# Patient Record
Sex: Female | Born: 1960 | Race: White | Hispanic: No | Smoking: Current every day smoker
Health system: Southern US, Community
[De-identification: ages and names within clinical notes are randomized; demographics above are authoritative.]

## PROBLEM LIST (undated history)

## (undated) DIAGNOSIS — E7800 Pure hypercholesterolemia, unspecified: Secondary | ICD-10-CM

## (undated) DIAGNOSIS — F419 Anxiety disorder, unspecified: Secondary | ICD-10-CM

## (undated) DIAGNOSIS — K219 Gastro-esophageal reflux disease without esophagitis: Secondary | ICD-10-CM

## (undated) DIAGNOSIS — IMO0001 Reserved for inherently not codable concepts without codable children: Secondary | ICD-10-CM

## (undated) DIAGNOSIS — F4000 Agoraphobia, unspecified: Secondary | ICD-10-CM

## (undated) DIAGNOSIS — I10 Essential (primary) hypertension: Secondary | ICD-10-CM

## (undated) DIAGNOSIS — F410 Panic disorder [episodic paroxysmal anxiety] without agoraphobia: Secondary | ICD-10-CM

## (undated) HISTORY — DX: Agoraphobia, unspecified: F40.00

---

## 2009-12-09 ENCOUNTER — Emergency Department: Payer: Self-pay | Admitting: Emergency Medicine

## 2009-12-26 ENCOUNTER — Ambulatory Visit: Payer: Self-pay | Admitting: Internal Medicine

## 2011-03-24 ENCOUNTER — Emergency Department: Payer: Self-pay | Admitting: Emergency Medicine

## 2011-06-26 ENCOUNTER — Ambulatory Visit: Payer: Self-pay | Admitting: Family Medicine

## 2012-11-16 ENCOUNTER — Encounter (HOSPITAL_COMMUNITY): Payer: Self-pay | Admitting: *Deleted

## 2012-11-16 ENCOUNTER — Emergency Department (HOSPITAL_COMMUNITY): Payer: Self-pay

## 2012-11-16 ENCOUNTER — Emergency Department (HOSPITAL_COMMUNITY)
Admission: EM | Admit: 2012-11-16 | Discharge: 2012-11-16 | Disposition: A | Discharge status: Home / Self Care | Payer: Self-pay | Attending: Emergency Medicine | Admitting: Emergency Medicine

## 2012-11-16 DIAGNOSIS — R509 Fever, unspecified: Secondary | ICD-10-CM | POA: Insufficient documentation

## 2012-11-16 DIAGNOSIS — R071 Chest pain on breathing: Secondary | ICD-10-CM | POA: Insufficient documentation

## 2012-11-16 DIAGNOSIS — K219 Gastro-esophageal reflux disease without esophagitis: Secondary | ICD-10-CM | POA: Insufficient documentation

## 2012-11-16 DIAGNOSIS — I10 Essential (primary) hypertension: Secondary | ICD-10-CM | POA: Insufficient documentation

## 2012-11-16 DIAGNOSIS — R062 Wheezing: Secondary | ICD-10-CM | POA: Insufficient documentation

## 2012-11-16 DIAGNOSIS — M549 Dorsalgia, unspecified: Secondary | ICD-10-CM | POA: Insufficient documentation

## 2012-11-16 DIAGNOSIS — F17200 Nicotine dependence, unspecified, uncomplicated: Secondary | ICD-10-CM | POA: Insufficient documentation

## 2012-11-16 DIAGNOSIS — Z791 Long term (current) use of non-steroidal anti-inflammatories (NSAID): Secondary | ICD-10-CM | POA: Insufficient documentation

## 2012-11-16 DIAGNOSIS — J209 Acute bronchitis, unspecified: Secondary | ICD-10-CM | POA: Insufficient documentation

## 2012-11-16 DIAGNOSIS — E7800 Pure hypercholesterolemia, unspecified: Secondary | ICD-10-CM | POA: Insufficient documentation

## 2012-11-16 DIAGNOSIS — R61 Generalized hyperhidrosis: Secondary | ICD-10-CM | POA: Insufficient documentation

## 2012-11-16 DIAGNOSIS — R0781 Pleurodynia: Secondary | ICD-10-CM

## 2012-11-16 DIAGNOSIS — Z79899 Other long term (current) drug therapy: Secondary | ICD-10-CM | POA: Insufficient documentation

## 2012-11-16 DIAGNOSIS — F411 Generalized anxiety disorder: Secondary | ICD-10-CM | POA: Insufficient documentation

## 2012-11-16 DIAGNOSIS — R110 Nausea: Secondary | ICD-10-CM | POA: Insufficient documentation

## 2012-11-16 DIAGNOSIS — Z7982 Long term (current) use of aspirin: Secondary | ICD-10-CM | POA: Insufficient documentation

## 2012-11-16 DIAGNOSIS — R630 Anorexia: Secondary | ICD-10-CM | POA: Insufficient documentation

## 2012-11-16 DIAGNOSIS — J40 Bronchitis, not specified as acute or chronic: Secondary | ICD-10-CM

## 2012-11-16 HISTORY — DX: Panic disorder (episodic paroxysmal anxiety): F41.0

## 2012-11-16 HISTORY — DX: Reserved for inherently not codable concepts without codable children: IMO0001

## 2012-11-16 HISTORY — DX: Anxiety disorder, unspecified: F41.9

## 2012-11-16 HISTORY — DX: Essential (primary) hypertension: I10

## 2012-11-16 HISTORY — DX: Pure hypercholesterolemia, unspecified: E78.00

## 2012-11-16 HISTORY — DX: Gastro-esophageal reflux disease without esophagitis: K21.9

## 2012-11-16 MED ORDER — CYCLOBENZAPRINE HCL 10 MG PO TABS
10.0000 mg | ORAL_TABLET | Freq: Once | ORAL | Status: AC
  Administered 2012-11-16: 10 mg via ORAL
  Filled 2012-11-16: qty 1

## 2012-11-16 MED ORDER — HYDROCODONE-ACETAMINOPHEN 5-325 MG PO TABS
2.0000 | ORAL_TABLET | Freq: Once | ORAL | Status: AC
  Administered 2012-11-16: 2 via ORAL
  Filled 2012-11-16: qty 2

## 2012-11-16 MED ORDER — HYDROCODONE-ACETAMINOPHEN 5-325 MG PO TABS: ORAL_TABLET | ORAL | Status: DC

## 2012-11-16 MED ORDER — PROMETHAZINE HCL 25 MG PO TABS: 25.0000 mg | ORAL_TABLET | Freq: Four times a day (QID) | ORAL | Status: DC | PRN

## 2012-11-16 MED ORDER — SULFAMETHOXAZOLE-TRIMETHOPRIM 800-160 MG PO TABS: 1.0000 | ORAL_TABLET | Freq: Two times a day (BID) | ORAL | Status: DC

## 2012-11-16 MED ORDER — CYCLOBENZAPRINE HCL 10 MG PO TABS: 10.0000 mg | ORAL_TABLET | Freq: Three times a day (TID) | ORAL | Status: DC | PRN

## 2012-11-16 MED ORDER — GUAIFENESIN ER 1200 MG PO TB12: 1.0000 | ORAL_TABLET | Freq: Two times a day (BID) | ORAL | Status: DC

## 2012-11-16 NOTE — ED Notes (Signed)
Sick x 1 week, fever, cough, pain in rt lat ribs.  White sputum.   Nausea, no vomiting. Diarrhea  yesterday

## 2012-11-16 NOTE — ED Notes (Signed)
Reports being sick for a week. Was coughing & heard something pop to right chest. Pt states pain w/ coughing & taking a deep breath.

## 2012-11-16 NOTE — ED Provider Notes (Signed)
History    This chart was scribed for Brittany Norrie, MD by Brittany Jacobson, ED Scribe. The patient was seen in room APA02/APA02. Patient's care was started at 2225.   CSN: [id number]  Arrival date & time 11/16/12  2158   First MD Initiated Contact with Patient 11/16/12 2225      Chief Complaint  Patient presents with  . Cough    The history is provided by the patient. No language interpreter was used.   Brittany Jacobson is a 52 y.o. female who presents to the Emergency Department complaining of a persistent, frequent productive cough for the past week that worsened last night. She states that she had flu-like symptoms last week with diaphroeis, fever, chills, nausea, vomiting, cough and decreased appetite. She states that her symptoms improved and she returned to work yesterday. She states last night her cough worsened again and heard cracking in her right posterior ribs. She reports her cough is productive with white/tan sputum. She reports associated sneezing, wheezing, nausea, clammy, subjective fever and chills. She denies any sore throat, rhinorrhea. She states that she used to take Ventolin for wheezing.   PCP: Dr. Jimmye Jacobson at [facility]  Past Medical History  Diagnosis Date  . Hypertension   . Hypercholesteremia   . Panic attacks   . Reflux   . Anxiety     History reviewed. No pertinent past surgical history.  History reviewed. No pertinent family history.  History  Substance Use Topics  . Smoking status: Current Every Day Smoker  . Smokeless tobacco: Not on file  . Alcohol Use: Yes  She admits to smoking a pack daily Occasional alcohol use Works at [employer] with spouse  OB History   Grav Para Term Preterm Abortions TAB SAB Ect Mult Living                  Review of Systems  Constitutional: Positive for fever, chills, diaphoresis and appetite change.  HENT: Negative for sore throat and rhinorrhea.   Respiratory: Positive for cough and  wheezing.   Gastrointestinal: Positive for nausea. Negative for vomiting, abdominal pain and diarrhea.  Musculoskeletal: Positive for back pain.  All other systems reviewed and are negative.    Allergies  Codeine  Home Medications   Current Outpatient Rx  Name  Route  Sig  Dispense  Refill  . ALPRAZolam (XANAX) 0.5 MG tablet   Oral   Take 0.25-1 mg by mouth daily as needed for sleep or anxiety.         Marland Kitchen amLODipine (NORVASC) 5 MG tablet   Oral   Take 5 mg by mouth every evening.         Marland Kitchen aspirin EC 325 MG tablet   Oral   Take 325 mg by mouth every evening.         Marland Kitchen atorvastatin (LIPITOR) 40 MG tablet   Oral   Take 40 mg by mouth every evening.         . citalopram (CELEXA) 40 MG tablet   Oral   Take 40 mg by mouth every evening.         . meloxicam (MOBIC) 15 MG tablet   Oral   Take 15 mg by mouth every evening.         . pantoprazole (PROTONIX) 40 MG tablet   Oral   Take 40 mg by mouth every evening.           Triage Vitals: BP  124/77  Pulse 81  Temp(Src) 98.2 F (36.8 C) (Oral)  Resp 18  Ht 5\' 2"  (1.575 m)  Wt 135 lb (61.236 kg)  BMI 24.69 kg/m2  SpO2 100%  Vital signs normal    Physical Exam  Nursing note and vitals reviewed. Constitutional: She is oriented to person, place, and time. She appears well-developed and well-nourished. No distress.  HENT:  Head: Normocephalic and atraumatic.  Right Ear: External ear normal.  Left Ear: External ear normal.  Nose: Nose normal.  Mouth/Throat: No oropharyngeal exudate.  Tongue is dry.   Eyes: Conjunctivae and EOM are normal. Pupils are equal, round, and reactive to light.  Neck: Normal range of motion. Neck supple. No tracheal deviation present.  Cardiovascular: Normal rate, regular rhythm and normal heart sounds.  Exam reveals no gallop and no friction rub.   No murmur heard. Pulmonary/Chest: Effort normal. No respiratory distress. She has decreased breath sounds. She has no wheezes.  She has no rhonchi. She has no rales. She exhibits no tenderness.    No localized chest wall tenderness although pt localized pain to right posterior/lateral ribs. Area of pain noted  Abdominal: Soft. Bowel sounds are normal. She exhibits no distension. There is no tenderness. There is no rebound and no guarding.  Musculoskeletal: Normal range of motion. She exhibits no edema and no tenderness.  Neurological: She is alert and oriented to person, place, and time.  Skin: Skin is warm and dry.  Psychiatric: She has a normal mood and affect. Her behavior is normal.    ED Course  Procedures (including critical care time)  Medications  HYDROcodone-acetaminophen (NORCO/VICODIN) 5-325 MG per tablet 2 tablet (not administered)  cyclobenzaprine (FLEXERIL) tablet 10 mg (not administered)     DIAGNOSTIC STUDIES: Oxygen Saturation is 100% on room air, normal by my interpretation.    COORDINATION OF CARE:  22:46-Discussed planned course of treatment with the patient including d/c with pain and nausea medication and an antibiotic, who is agreeable at this time. Pt has no wheezing at this time.     Dg Chest 2 View  11/16/2012  *RADIOLOGY REPORT*  Clinical Data: Fever.  Cough.  Right rib pain.  CHEST - 2 VIEW  Comparison: None.  Findings: Lateral right rib deformities favor old fractures.  The lungs appear clear. Cardiac and mediastinal contours appear unremarkable.  Linear subsegmental atelectasis or scarring noted in the lingula. No pleural effusion noted.  IMPRESSION:  1.  Lingular subsegmental atelectasis or scarring. 2.  Old right lateral rib fractures.   Original Report Authenticated By: Van Clines, M.D.      1. Bronchitis   2. Nausea   3. Pleuritic chest pain     New Prescriptions   CYCLOBENZAPRINE (FLEXERIL) 10 MG TABLET    Take 1 tablet (10 mg total) by mouth 3 (three) times daily as needed for muscle spasms.   GUAIFENESIN 1200 MG TB12    Take 1 tablet (1,200 mg total) by  mouth 2 (two) times daily.   HYDROCODONE-ACETAMINOPHEN (NORCO/VICODIN) 5-325 MG PER TABLET    Take 1 or 2 po Q 6hrs for pain   PROMETHAZINE (PHENERGAN) 25 MG TABLET    Take 1 tablet (25 mg total) by mouth every 6 (six) hours as needed for nausea.   SULFAMETHOXAZOLE-TRIMETHOPRIM (SEPTRA DS) 800-160 MG PER TABLET    Take 1 tablet by mouth every 12 (twelve) hours.   Plan discharge  Rolland Porter, MD, FACEP   MDM    I personally performed the services  described in this documentation, which was scribed in my presence. The recorded information has been reviewed and considered.  Rolland Porter, MD, Abram Sander      Brittany Norrie, MD 11/16/12 2318

## 2012-11-16 NOTE — ED Notes (Signed)
Pt alert & oriented x4, stable gait. Patient given discharge instructions, paperwork & prescription(s). Patient  instructed to stop at the registration desk to finish any additional paperwork. Patient verbalized understanding. Pt left department w/ no further questions. 

## 2014-09-27 ENCOUNTER — Ambulatory Visit: Payer: Self-pay | Admitting: Family

## 2014-10-07 LAB — HM HEPATITIS C SCREENING LAB: HM Hepatitis Screen: NEGATIVE

## 2014-10-07 LAB — HM HIV SCREENING LAB: HM HIV Screening: NEGATIVE

## 2016-08-03 ENCOUNTER — Encounter (HOSPITAL_COMMUNITY): Payer: Self-pay | Admitting: Emergency Medicine

## 2016-08-03 ENCOUNTER — Emergency Department (HOSPITAL_COMMUNITY): Payer: Managed Care, Other (non HMO)

## 2016-08-03 ENCOUNTER — Emergency Department (HOSPITAL_COMMUNITY)
Admission: EM | Admit: 2016-08-03 | Discharge: 2016-08-03 | Disposition: A | Discharge status: Home / Self Care | Payer: Managed Care, Other (non HMO) | Attending: Emergency Medicine | Admitting: Emergency Medicine

## 2016-08-03 DIAGNOSIS — M545 Low back pain, unspecified: Secondary | ICD-10-CM

## 2016-08-03 DIAGNOSIS — Z760 Encounter for issue of repeat prescription: Secondary | ICD-10-CM | POA: Diagnosis not present

## 2016-08-03 DIAGNOSIS — R109 Unspecified abdominal pain: Secondary | ICD-10-CM | POA: Insufficient documentation

## 2016-08-03 DIAGNOSIS — I10 Essential (primary) hypertension: Secondary | ICD-10-CM | POA: Diagnosis not present

## 2016-08-03 DIAGNOSIS — Z79899 Other long term (current) drug therapy: Secondary | ICD-10-CM | POA: Insufficient documentation

## 2016-08-03 DIAGNOSIS — F17200 Nicotine dependence, unspecified, uncomplicated: Secondary | ICD-10-CM | POA: Insufficient documentation

## 2016-08-03 LAB — URINALYSIS, ROUTINE W REFLEX MICROSCOPIC
Bilirubin Urine: NEGATIVE
Glucose, UA: NEGATIVE mg/dL
Ketones, ur: NEGATIVE mg/dL
Leukocytes, UA: NEGATIVE
Nitrite: NEGATIVE
Protein, ur: >300 mg/dL — AB
Specific Gravity, Urine: 1.025 (ref 1.005–1.030)
pH: 6.5 (ref 5.0–8.0)

## 2016-08-03 LAB — URINE MICROSCOPIC-ADD ON

## 2016-08-03 MED ORDER — HYDROXYZINE HCL 25 MG PO TABS: 25.0000 mg | ORAL_TABLET | Freq: Four times a day (QID) | ORAL | 0 refills | Status: DC | PRN

## 2016-08-03 MED ORDER — HYDROCODONE-ACETAMINOPHEN 5-325 MG PO TABS: ORAL_TABLET | ORAL | 0 refills | Status: DC

## 2016-08-03 MED ORDER — METHOCARBAMOL 500 MG PO TABS: 1000.0000 mg | ORAL_TABLET | Freq: Four times a day (QID) | ORAL | 0 refills | Status: DC | PRN

## 2016-08-03 MED ORDER — OXYCODONE-ACETAMINOPHEN 5-325 MG PO TABS
1.0000 | ORAL_TABLET | Freq: Once | ORAL | Status: AC
Start: 2016-08-03 — End: 2016-08-03
  Administered 2016-08-03: 1 via ORAL
  Filled 2016-08-03: qty 1

## 2016-08-03 MED ORDER — ONDANSETRON 8 MG PO TBDP
8.0000 mg | ORAL_TABLET | Freq: Once | ORAL | Status: AC
  Administered 2016-08-03: 8 mg via ORAL
  Filled 2016-08-03: qty 1

## 2016-08-03 NOTE — Discharge Instructions (Signed)
Take the prescriptions as directed.  Apply moist heat or ice to the area(s) of discomfort, for 15 minutes at a time, several times per day for the next few days.  Do not fall asleep on a heating or ice pack.  Call your regular medical doctor on Monday to schedule a follow up appointment this week.  Return to the Emergency Department immediately if worsening. ° °

## 2016-08-03 NOTE — ED Triage Notes (Signed)
PT c/o right flank pain with painful urination at times for 3-4 days. PT states she also had a vaginal yeast infection that seems to have subsided with home OTC medication x3 days ago.

## 2016-08-03 NOTE — ED Provider Notes (Signed)
[facility] DEPT Provider Note   CSN: [id number] Arrival date & time: 08/03/16  1353     History   Chief Complaint Chief Complaint  Patient presents with  . Flank Pain    HPI Brittany Jacobson is a 55 y.o. female.   Flank Pain    Pt was seen at 1405. Per pt, c/o gradual onset and persistence of constant right sided low back "pain" for the past 3 to 4 days. Pain worsens with palpation of the area and body position changes. Pt states she was having dysuria, but treated herself with OTC vaginal yeast cream with improvement. Denies incont/retention of bowel or bladder, no saddle anesthesia, no focal motor weakness, no tingling/numbness in extremities, no fevers, no injury, no abd pain, no further dysuria, no hematuria.   The symptoms have been associated with no other complaints.   Past Medical History:  Diagnosis Date  . Anxiety   . Hypercholesteremia   . Hypertension   . Panic attacks   . Reflux     There are no active problems to display for this patient.   History reviewed. No pertinent surgical history.  OB History    Gravida Para Term Preterm AB Living   2         2   SAB TAB Ectopic Multiple Live Births                   Home Medications    Prior to Admission medications   Medication Sig Start Date End Date Taking? Authorizing Provider  ALPRAZolam Duanne Moron) 0.5 MG tablet Take 0.25-1 mg by mouth daily as needed for sleep or anxiety.    Historical Provider, MD  amLODipine (NORVASC) 5 MG tablet Take 5 mg by mouth every evening.    Historical Provider, MD  aspirin EC 325 MG tablet Take 325 mg by mouth every evening.    Historical Provider, MD  atorvastatin (LIPITOR) 40 MG tablet Take 40 mg by mouth every evening.    Historical Provider, MD  citalopram (CELEXA) 40 MG tablet Take 40 mg by mouth every evening.    Historical Provider, MD  cyclobenzaprine (FLEXERIL) 10 MG tablet Take 1 tablet (10 mg total) by mouth 3 (three) times daily as needed for muscle spasms.  11/16/12   Rolland Porter, MD  Guaifenesin 1200 MG TB12 Take 1 tablet (1,200 mg total) by mouth 2 (two) times daily. 11/16/12   Rolland Porter, MD  HYDROcodone-acetaminophen (NORCO/VICODIN) 5-325 MG per tablet Take 1 or 2 po Q 6hrs for pain 11/16/12   Rolland Porter, MD  meloxicam (MOBIC) 15 MG tablet Take 15 mg by mouth every evening.    Historical Provider, MD  pantoprazole (PROTONIX) 40 MG tablet Take 40 mg by mouth every evening.    Historical Provider, MD  promethazine (PHENERGAN) 25 MG tablet Take 1 tablet (25 mg total) by mouth every 6 (six) hours as needed for nausea. 11/16/12   Rolland Porter, MD  sulfamethoxazole-trimethoprim (SEPTRA DS) 800-160 MG per tablet Take 1 tablet by mouth every 12 (twelve) hours. 11/16/12   Rolland Porter, MD    Family History History reviewed. No pertinent family history.  Social History Social History  Substance Use Topics  . Smoking status: Current Every Day Smoker    Packs/day: 2.00  . Smokeless tobacco: Never Used  . Alcohol use Yes     Comment: occassional     Allergies   Codeine   Review of Systems Review of Systems  Genitourinary: Positive for  flank pain.  ROS: Statement: All systems negative except as marked or noted in the HPI; Constitutional: Negative for fever and chills. ; ; Eyes: Negative for eye pain, redness and discharge. ; ; ENMT: Negative for ear pain, hoarseness, nasal congestion, sinus pressure and sore throat. ; ; Cardiovascular: Negative for chest pain, palpitations, diaphoresis, dyspnea and peripheral edema. ; ; Respiratory: Negative for cough, wheezing and stridor. ; ; Gastrointestinal: Negative for nausea, vomiting, diarrhea, abdominal pain, blood in stool, hematemesis, jaundice and rectal bleeding. . ; ; Genitourinary: +resolved dysuria. Negative for hematuria. ; ; GYN:  No pelvic pain, no vaginal bleeding, no vaginal discharge, no vulvar pain. ;; Musculoskeletal: +LBP. Negative for neck pain. Negative for swelling and trauma.; ; Skin: Negative for  pruritus, rash, abrasions, blisters, bruising and skin lesion.; ; Neuro: Negative for headache, lightheadedness and neck stiffness. Negative for weakness, altered level of consciousness, altered mental status, extremity weakness, paresthesias, involuntary movement, seizure and syncope.      Physical Exam Updated Vital Signs BP 116/97 (BP Location: Right Arm)   Pulse 83   Temp 98 F (36.7 C) (Oral)   Resp 18   Ht 5\' 3"  (1.6 m)   Wt 154 lb (69.9 kg)   SpO2 96%   BMI 27.28 kg/m   Physical Exam 1410: Physical examination:  Nursing notes reviewed; Vital signs and O2 SAT reviewed;  Constitutional: Well developed, Well nourished, Well hydrated, In no acute distress; Head:  Normocephalic, atraumatic; Eyes: EOMI, PERRL, No scleral icterus; ENMT: Mouth and pharynx normal, Mucous membranes moist; Neck: Supple, Full range of motion, No lymphadenopathy; Cardiovascular: Regular rate and rhythm, No murmur, rub, or gallop; Respiratory: Breath sounds clear & equal bilaterally, No rales, rhonchi, wheezes.  Speaking full sentences with ease, Normal respiratory effort/excursion; Chest: Nontender, Movement normal; Abdomen: Soft, Nontender, Nondistended, Normal bowel sounds; Genitourinary: No CVA tenderness; Spine:  No midline CS, TS, LS tenderness. +TTP right lumbar paraspinal muscles.;; Extremities: Pulses normal, No tenderness, No edema, No calf edema or asymmetry.; Neuro: AA&Ox3, Major CN grossly intact.  Speech clear. No gross focal motor or sensory deficits in extremities. Climbs on and off stretcher easily by herself. Gait steady.; Skin: Color normal, Warm, Dry.   ED Treatments / Results  Labs (all labs ordered are listed, but only abnormal results are displayed)   EKG  EKG Interpretation None       Radiology   Procedures Procedures (including critical care time)  Medications Ordered in ED Medications  oxyCODONE-acetaminophen (PERCOCET/ROXICET) 5-325 MG per tablet 1 tablet (not  administered)     Initial Impression / Assessment and Plan / ED Course  I have reviewed the triage vital signs and the nursing notes.  Pertinent labs & imaging results that were available during my care of the patient were reviewed by me and considered in my medical decision making (see chart for details).   MDM Reviewed: previous chart, nursing note and vitals Interpretation: labs and CT scan    Results for orders placed or performed during the hospital encounter of 08/03/16  Urinalysis, Routine w reflex microscopic (not at [facility])  Result Value Ref Range   Color, Urine YELLOW YELLOW   APPearance CLEAR CLEAR   Specific Gravity, Urine 1.025 1.005 - 1.030   pH 6.5 5.0 - 8.0   Glucose, UA NEGATIVE NEGATIVE mg/dL   Hgb urine dipstick LARGE (A) NEGATIVE   Bilirubin Urine NEGATIVE NEGATIVE   Ketones, ur NEGATIVE NEGATIVE mg/dL   Protein, ur >300 (A) NEGATIVE mg/dL  Nitrite NEGATIVE NEGATIVE   Leukocytes, UA NEGATIVE NEGATIVE  Urine microscopic-add on  Result Value Ref Range   Squamous Epithelial / LPF 0-5 (A) NONE SEEN   WBC, UA 0-5 0 - 5 WBC/hpf   RBC / HPF 6-30 0 - 5 RBC/hpf   Bacteria, UA MANY (A) NONE SEEN   Urine-Other MUCOUS PRESENT     Ct Renal Stone Study Result Date: 08/03/2016 CLINICAL DATA:  Right flank pain with painful urination at times for 3-4 days. EXAM: CT ABDOMEN AND PELVIS WITHOUT CONTRAST TECHNIQUE: Multidetector CT imaging of the abdomen and pelvis was performed following the standard protocol without IV contrast. COMPARISON:  None. FINDINGS: Lower chest: No acute abnormality. Hepatobiliary: No focal liver abnormality is seen. No gallstones, gallbladder wall thickening, or biliary dilatation. Pancreas: Unremarkable. No pancreatic ductal dilatation or surrounding inflammatory changes. Spleen: Normal in size without focal abnormality. Adrenals/Urinary Tract: Normal adrenal glands. Small nonobstructing 4 mm left renal calculus. No right urolithiasis. No  obstructive uropathy. No perinephric fluid. Decompressed bladder. Stomach/Bowel: Stomach is within normal limits. Small hiatal hernia. Appendix appears normal. No evidence of bowel wall thickening, distention, or inflammatory changes. Vascular/Lymphatic: Abdominal aortic atherosclerosis. Normal caliber abdominal aorta. No lymphadenopathy. Reproductive: Uterus and bilateral adnexa are unremarkable. Other: No fluid collection or hematoma. Musculoskeletal: No lytic or sclerotic osseous lesion. No acute osseous abnormality. IMPRESSION: 1. 4 mm nonobstructing left renal calculus. No obstructive uropathy. 2. Small hiatal hernia. Electronically Signed   By: Kathreen Devoid   On: 08/03/2016 15:34    1540:  Workup reassuring. No clear UTI on Udip and pt denies dysuria; UC is pending. Tx symptomatically. Pt also requesting refill of her klonopin. Pt made aware ED is not the venue to refill benzo rx; will rx alternative prn anxiety medication. Pt verb understanding and agreeable. Dx and testing d/w pt and family.  Questions answered.  Verb understanding, agreeable to d/c home with outpt f/u.          Final Clinical Impressions(s) / ED Diagnoses   Final diagnoses:  None    New Prescriptions New Prescriptions   No medications on file      Francine Graven, DO 08/05/16 2055

## 2016-08-05 LAB — URINE CULTURE

## 2017-03-07 ENCOUNTER — Encounter: Payer: Self-pay | Admitting: Emergency Medicine

## 2017-03-07 ENCOUNTER — Emergency Department
Admission: EM | Admit: 2017-03-07 | Discharge: 2017-03-07 | Disposition: A | Discharge status: Home / Self Care | Payer: Managed Care, Other (non HMO) | Attending: Emergency Medicine | Admitting: Emergency Medicine

## 2017-03-07 ENCOUNTER — Emergency Department: Payer: Managed Care, Other (non HMO)

## 2017-03-07 DIAGNOSIS — R600 Localized edema: Secondary | ICD-10-CM | POA: Diagnosis not present

## 2017-03-07 DIAGNOSIS — F17200 Nicotine dependence, unspecified, uncomplicated: Secondary | ICD-10-CM | POA: Insufficient documentation

## 2017-03-07 DIAGNOSIS — Z7982 Long term (current) use of aspirin: Secondary | ICD-10-CM | POA: Diagnosis not present

## 2017-03-07 DIAGNOSIS — I10 Essential (primary) hypertension: Secondary | ICD-10-CM | POA: Diagnosis not present

## 2017-03-07 DIAGNOSIS — Z79899 Other long term (current) drug therapy: Secondary | ICD-10-CM | POA: Insufficient documentation

## 2017-03-07 DIAGNOSIS — M7989 Other specified soft tissue disorders: Secondary | ICD-10-CM | POA: Diagnosis present

## 2017-03-07 NOTE — ED Provider Notes (Signed)
[facility] Emergency Department Provider Note   ____________________________________________   I have reviewed the triage vital signs and the nursing notes.   HISTORY  Chief Complaint Leg Swelling    HPI Brittany Jacobson is a 56 y.o. female presents withleft lower leg swelling to the foot for approximately 6 days. Patient denies any history of coagulation or circulation problems. She also denies any recent travels, prolonged sitting or resting positions, change of medications, or recent surgical procedures. Several insect bites were noted on the lower leg, patient felt insect bites may be related to swelling of her leg. Patient is a current smoker. She denies shortness of breath or chest pain. Patient was seen at an urgent care earlier today and recommended that she come to the emergency department to have her left lower extremity evaluated for possible DVT. Patient denies fever, chills, headache, vision changes, chest pain, chest tightness, shortness of breath, abdominal pain, nausea and vomiting.  Past Medical History:  Diagnosis Date  . Anxiety   . Hypercholesteremia   . Hypertension   . Panic attacks   . Reflux     There are no active problems to display for this patient.   History reviewed. No pertinent surgical history.  Prior to Admission medications   Medication Sig Start Date End Date Taking? Authorizing Provider  amLODipine (NORVASC) 5 MG tablet Take 5 mg by mouth every evening.    [provider]  aspirin EC 325 MG tablet Take 325 mg by mouth every evening.    [provider]  atorvastatin (LIPITOR) 40 MG tablet Take 40 mg by mouth every evening.    [provider]  citalopram (CELEXA) 40 MG tablet Take 40 mg by mouth every evening.    [provider]  clonazePAM (KLONOPIN) 0.5 MG tablet Take 0.5 mg by mouth 2 (two) times daily. 06/05/16   [provider]  cyclobenzaprine (FLEXERIL) 10 MG tablet  Take 1 tablet (10 mg total) by mouth 3 (three) times daily as needed for muscle spasms. Patient not taking: Reported on 08/03/2016 11/16/12   Rolland Porter, MD  Guaifenesin 1200 MG TB12 Take 1 tablet (1,200 mg total) by mouth 2 (two) times daily. Patient not taking: Reported on 08/03/2016 11/16/12   Rolland Porter, MD  HYDROcodone-acetaminophen (NORCO/VICODIN) 5-325 MG tablet 1 or 2 tabs PO q6 hours prn pain 08/03/16   Francine Graven, DO  hydrOXYzine (ATARAX/VISTARIL) 25 MG tablet Take 1 tablet (25 mg total) by mouth every 6 (six) hours as needed for anxiety. 08/03/16   Francine Graven, DO  meloxicam (MOBIC) 15 MG tablet Take 15 mg by mouth every evening.    [provider]  methocarbamol (ROBAXIN) 500 MG tablet Take 2 tablets (1,000 mg total) by mouth 4 (four) times daily as needed for muscle spasms (muscle spasm/pain). 08/03/16   Francine Graven, DO  pantoprazole (PROTONIX) 40 MG tablet Take 40 mg by mouth every evening.    [provider]  promethazine (PHENERGAN) 25 MG tablet Take 1 tablet (25 mg total) by mouth every 6 (six) hours as needed for nausea. Patient not taking: Reported on 08/03/2016 11/16/12   Rolland Porter, MD  sulfamethoxazole-trimethoprim (SEPTRA DS) 800-160 MG per tablet Take 1 tablet by mouth every 12 (twelve) hours. Patient not taking: Reported on 08/03/2016 11/16/12   Rolland Porter, MD    Allergies Codeine  History reviewed. No pertinent family history.  Social History Social History  Substance Use Topics  . Smoking status: Current Every  Day Smoker    Packs/day: 2.00  . Smokeless tobacco: Never Used  . Alcohol use Yes     Comment: occassional    Review of Systems Constitutional: Negative for fever/chills Eyes: No visual changes. ENT:  Negative for sore throat and for difficulty swallowing Cardiovascular: Denies chest pain. Respiratory: Denies cough Denies shortness of breath. Gastrointestinal: No abdominal pain.  No nausea, vomiting,  diarrhea. Genitourinary: Negative for dysuria. Musculoskeletal: Left lower leg swelling to the foot. Skin:Negative for rash. Neurological: Negative for headaches.  Negative focal weakness or numbness.  ____________________________________________   PHYSICAL EXAM:  VITAL SIGNS: ED Triage Vitals [03/07/17 1815]  Enc Vitals Group     BP (!) 161/78     Pulse Rate 81     Resp 18     Temp 98.6 F (37 C)     Temp Source Oral     SpO2 97 %     Weight 158 lb (71.7 kg)     Height 5\' 3"  (1.6 m)     Head Circumference      Peak Flow      Pain Score 5     Pain Loc      Pain Edu?      Excl. in [place]?     Constitutional: Alert and oriented. Well appearing and in no acute distress.  Head: Normocephalic and atraumatic. Eyes: Conjunctivae are normal. PERRL.  Cardiovascular: Normal rate, regular rhythm. Normal distal pulses. Respiratory: Normal respiratory effort. No wheezes/rales/rhonchi. Lungs CTAB Musculoskeletal: Nontender with normal range of motion in all extremities. Left lower extremity range of motion and sensation intact. Left lower leg edema present nonpitting. Pedal and malleoli pulses present. Neurologic: Normal speech and language. No gross focal neurologic deficits are appreciated. No sensory loss or abnormal reflexes.  Skin:  Skin is warm, dry and intact. No rash noted. Psychiatric: Mood and affect are normal.  ____________________________________________   LABS (all labs ordered are listed, but only abnormal results are displayed)  Labs Reviewed - No data to display ____________________________________________  EKG None ____________________________________________  RADIOLOGY Left lower extremity venous Doppler ultrasound IMPRESSION: No evidence of DVT within the left lower extremity. ____________________________________________   PROCEDURES  Procedure(s) performed: No    Critical Care performed: no ____________________________________________   INITIAL  IMPRESSION / ASSESSMENT AND PLAN / ED COURSE  Pertinent labs & imaging results that were available during my care of the patient were reviewed by me and considered in my medical decision making (see chart for details).  Patient presents with left lower leg edema from the knee to the foot for approximate 6 days. Patient evaluated urgent care and came to the emergency department for further evaluation of possible DVT. Physical exam findings and ultrasound were reassuring of no DVT present in the left lower leg. Left lower extremity swelling was nonpitting, sensation and pulses were present. Patient plans to follow up with her primary care provider regarding lower extremity swelling. Patient informed of clinical course, understand medical decision-making process, and agree with plan. Patient was advised to return to the emergency department for symptoms that change or worsen.      ____________________________________________   FINAL CLINICAL IMPRESSION(S) / ED DIAGNOSES  Final diagnoses:  Leg edema, right       NEW MEDICATIONS STARTED DURING THIS VISIT:  Discharge Medication List as of 03/07/2017  8:20 PM       Note:  This document was prepared using Dragon voice recognition software and may include unintentional dictation errors.  Jerolyn Shin, PA-C 03/07/17 2257    Lavonia Drafts, MD 03/07/17 [phone]

## 2017-03-07 NOTE — ED Triage Notes (Signed)
Pt has had swelling to left foot/ankle for 6 days. Sent for dvt r/o. No hx dvt. Is smoker. No travel. No CP/SHOB.

## 2017-03-27 ENCOUNTER — Telehealth: Payer: Self-pay

## 2017-03-27 NOTE — Telephone Encounter (Signed)
Pt moved from [place] to the area and needs a PCP here. Would you accept pt? [employer]. Renaldo Fiddler, CMA

## 2017-05-12 ENCOUNTER — Other Ambulatory Visit: Payer: Self-pay | Admitting: Physician Assistant

## 2017-05-12 ENCOUNTER — Ambulatory Visit
Admission: RE | Admit: 2017-05-12 | Discharge: 2017-05-12 | Disposition: A | Discharge status: Home / Self Care | Payer: Managed Care, Other (non HMO) | Source: Ambulatory Visit | Attending: Physician Assistant | Admitting: Physician Assistant

## 2017-05-12 ENCOUNTER — Emergency Department
Admission: EM | Admit: 2017-05-12 | Discharge: 2017-05-12 | Disposition: A | Discharge status: Other Acute Inpatient Hospital | Payer: 59

## 2017-05-12 DIAGNOSIS — R600 Localized edema: Secondary | ICD-10-CM | POA: Insufficient documentation

## 2017-05-12 DIAGNOSIS — M7989 Other specified soft tissue disorders: Secondary | ICD-10-CM

## 2017-05-12 NOTE — ED Notes (Signed)
Pt registered in error, supposed to be Outpatient

## 2017-06-05 ENCOUNTER — Ambulatory Visit (INDEPENDENT_AMBULATORY_CARE_PROVIDER_SITE_OTHER): Payer: Managed Care, Other (non HMO) | Admitting: Family Medicine

## 2017-06-05 ENCOUNTER — Encounter: Payer: Self-pay | Admitting: Family Medicine

## 2017-06-05 VITALS — BP 168/90 | HR 85 | Temp 99.1°F | Ht 63.2 in | Wt 166.2 lb

## 2017-06-05 DIAGNOSIS — Z1211 Encounter for screening for malignant neoplasm of colon: Secondary | ICD-10-CM

## 2017-06-05 DIAGNOSIS — R609 Edema, unspecified: Secondary | ICD-10-CM

## 2017-06-05 DIAGNOSIS — F410 Panic disorder [episodic paroxysmal anxiety] without agoraphobia: Secondary | ICD-10-CM | POA: Insufficient documentation

## 2017-06-05 DIAGNOSIS — I10 Essential (primary) hypertension: Secondary | ICD-10-CM | POA: Diagnosis not present

## 2017-06-05 DIAGNOSIS — G8929 Other chronic pain: Secondary | ICD-10-CM

## 2017-06-05 DIAGNOSIS — Z1231 Encounter for screening mammogram for malignant neoplasm of breast: Secondary | ICD-10-CM

## 2017-06-05 DIAGNOSIS — E7800 Pure hypercholesterolemia, unspecified: Secondary | ICD-10-CM | POA: Insufficient documentation

## 2017-06-05 DIAGNOSIS — M5441 Lumbago with sciatica, right side: Secondary | ICD-10-CM

## 2017-06-05 DIAGNOSIS — Z1239 Encounter for other screening for malignant neoplasm of breast: Secondary | ICD-10-CM

## 2017-06-05 MED ORDER — PANTOPRAZOLE SODIUM 40 MG PO TBEC: 40.0000 mg | DELAYED_RELEASE_TABLET | Freq: Every evening | ORAL | 1 refills | Status: DC

## 2017-06-05 MED ORDER — CITALOPRAM HYDROBROMIDE 40 MG PO TABS: 40.0000 mg | ORAL_TABLET | Freq: Every evening | ORAL | 1 refills | Status: DC

## 2017-06-05 MED ORDER — ATORVASTATIN CALCIUM 40 MG PO TABS: 80.0000 mg | ORAL_TABLET | Freq: Every evening | ORAL | 1 refills | Status: DC

## 2017-06-05 MED ORDER — MELOXICAM 15 MG PO TABS: 15.0000 mg | ORAL_TABLET | Freq: Every evening | ORAL | 1 refills | Status: DC

## 2017-06-05 MED ORDER — CYCLOBENZAPRINE HCL 10 MG PO TABS: 10.0000 mg | ORAL_TABLET | Freq: Every day | ORAL | 0 refills | Status: DC

## 2017-06-05 MED ORDER — HYDROCHLOROTHIAZIDE 25 MG PO TABS: 25.0000 mg | ORAL_TABLET | Freq: Every day | ORAL | 1 refills | Status: DC

## 2017-06-05 MED ORDER — HYDROXYZINE HCL 25 MG PO TABS: 25.0000 mg | ORAL_TABLET | Freq: Three times a day (TID) | ORAL | 1 refills | Status: DC

## 2017-06-05 NOTE — Assessment & Plan Note (Signed)
Not under good control. Off meds. Restart HCTZ to help with swelling and recheck 1 month.

## 2017-06-05 NOTE — Progress Notes (Signed)
BP (!) 168/90 (BP Location: Left Arm, Patient Position: Sitting, Cuff Size: Normal)   Pulse 85   Temp 99.1 F (37.3 C)   Ht 5' 3.2" (1.605 m)   Wt 166 lb 4 oz (75.4 kg)   SpO2 97%   BMI 29.26 kg/m    Subjective:    Patient ID: Brittany Jacobson, female    DOB: 07-10-1961, 56 y.o.   MRN: [id number]  HPI: Brittany Jacobson is a 56 y.o. female who presents today to establish care. Was seeing a doctor in [place]. But hasn't seen them in about 7 months.   Chief Complaint  Patient presents with  . panic attacks  . Edema  . Joint Pain  . Back Pain   For the past several months she has been having some swelling in her legs. Swelling usually happens when she is on her feet for a long period of time. She notes that if she is on her feet for more than 3 hours then it tends to swell. Has not tried any compression stocking. Has not seen vascular doctor. Not better 1st thing in the AM. She notes that her legs hurt and will ache, make her lose her balance and fall. She notes that she will also have some numbness and tingling in her legs.    HYPERTENSION / HYPERLIPIDEMIA- off of all medicine for at least 2 weeks.  Satisfied with current treatment? no Duration of hypertension: chronic BP monitoring frequency: not checking BP medication side effects: no Past BP meds: amlodipine Duration of hyperlipidemia: chronic Cholesterol medication side effects: no Cholesterol supplements: fish oil Past cholesterol medications: atorvastatin Medication compliance: poor compliance Aspirin: no Recent stressors: yes Recurrent headaches: no Visual changes: no Palpitations: no Dyspnea: no Chest pain: no Lower extremity edema: no Dizzy/lightheaded: no  ANXIETY/STRESS Duration:uncontrolled Anxious mood: yes  Excessive worrying: yes Irritability: yes  Sweating: yes Nausea: yes Palpitations:yes Hyperventilation: yes Panic attacks: yes Agoraphobia: yes  Obscessions/compulsions: yes Depressed mood:  yes Depression screen PHQ 2/9 06/05/2017  Decreased Interest 3  Down, Depressed, Hopeless 3  PHQ - 2 Score 6  Altered sleeping 3  Tired, decreased energy 3  Change in appetite 3  Feeling bad or failure about yourself  3  Trouble concentrating 3  Moving slowly or fidgety/restless 3  Suicidal thoughts 0  PHQ-9 Score 24  Difficult doing work/chores Very difficult   Anhedonia: no Weight changes: no Insomnia: yes hard to fall asleep  Hypersomnia: no Fatigue/loss of energy: yes Feelings of worthlessness: yes Feelings of guilt: yes Impaired concentration/indecisiveness: yes Suicidal ideations: no  Crying spells: yes Recent Stressors/Life Changes: yes   Relationship problems: yes   Family stress: yes     Financial stress: yes    Job stress: yes    Recent death/loss: no  BACK PAIN Duration: about a month Mechanism of injury: unknown Location: Right and low back Onset: sudden Severity: severe Quality: sharp and aching Frequency: constant Radiation: R leg below the knee Aggravating factors: lifting Alleviating factors: nothing Status: stable Treatments attempted: rest, ice, heat, APAP, ibuprofen and aleve  Relief with NSAIDs?: no Nighttime pain:  yes Paresthesias / decreased sensation:  yes Bowel / bladder incontinence:  no Fevers:  no Dysuria / urinary frequency:  no  Active Ambulatory Problems    Diagnosis Date Noted  . Panic attacks   . Hypercholesteremia   . Hypertension    Resolved Ambulatory Problems    Diagnosis Date Noted  . No Resolved Ambulatory Problems  Past Medical History:  Diagnosis Date  . Agoraphobia   . Anxiety   . Hypercholesteremia   . Hypertension   . Panic attacks   . Panic attacks   . Reflux    History reviewed. No pertinent surgical history.  Outpatient Encounter Prescriptions as of 06/05/2017  Medication Sig  . atorvastatin (LIPITOR) 40 MG tablet Take 2 tablets (80 mg total) by mouth every evening.  . citalopram (CELEXA) 40 MG  tablet Take 1 tablet (40 mg total) by mouth every evening.  . Multiple Vitamin (MULTIVITAMIN) tablet Take 1 tablet by mouth daily.  . Omega-3 Fatty Acids (FISH OIL) 1200 MG CAPS Take by mouth.  . [DISCONTINUED] amLODipine (NORVASC) 5 MG tablet Take 5 mg by mouth every evening.  . [DISCONTINUED] atorvastatin (LIPITOR) 40 MG tablet Take 80 mg by mouth every evening.   . [DISCONTINUED] citalopram (CELEXA) 40 MG tablet Take 40 mg by mouth every evening.  . clonazePAM (KLONOPIN) 0.5 MG tablet Take 0.5 mg by mouth 2 (two) times daily.  . cyclobenzaprine (FLEXERIL) 10 MG tablet Take 1 tablet (10 mg total) by mouth at bedtime.  . hydrochlorothiazide (HYDRODIURIL) 25 MG tablet Take 1 tablet (25 mg total) by mouth daily.  . hydrOXYzine (ATARAX/VISTARIL) 25 MG tablet Take 1 tablet (25 mg total) by mouth 3 (three) times daily.  . meloxicam (MOBIC) 15 MG tablet Take 1 tablet (15 mg total) by mouth every evening.  . pantoprazole (PROTONIX) 40 MG tablet Take 1 tablet (40 mg total) by mouth every evening.  . [DISCONTINUED] aspirin EC 325 MG tablet Take 325 mg by mouth every evening.  . [DISCONTINUED] cyclobenzaprine (FLEXERIL) 10 MG tablet Take 1 tablet (10 mg total) by mouth 3 (three) times daily as needed for muscle spasms. (Patient not taking: Reported on 08/03/2016)  . [DISCONTINUED] Guaifenesin 1200 MG TB12 Take 1 tablet (1,200 mg total) by mouth 2 (two) times daily. (Patient not taking: Reported on 08/03/2016)  . [DISCONTINUED] HYDROcodone-acetaminophen (NORCO/VICODIN) 5-325 MG tablet 1 or 2 tabs PO q6 hours prn pain  . [DISCONTINUED] hydrOXYzine (ATARAX/VISTARIL) 25 MG tablet Take 1 tablet (25 mg total) by mouth every 6 (six) hours as needed for anxiety.  . [DISCONTINUED] hydrOXYzine (ATARAX/VISTARIL) 25 MG tablet Take 1 tablet (25 mg total) by mouth 3 (three) times daily.  . [DISCONTINUED] meloxicam (MOBIC) 15 MG tablet Take 15 mg by mouth every evening.  . [DISCONTINUED] methocarbamol (ROBAXIN) 500 MG  tablet Take 2 tablets (1,000 mg total) by mouth 4 (four) times daily as needed for muscle spasms (muscle spasm/pain).  . [DISCONTINUED] pantoprazole (PROTONIX) 40 MG tablet Take 40 mg by mouth every evening.  . [DISCONTINUED] promethazine (PHENERGAN) 25 MG tablet Take 1 tablet (25 mg total) by mouth every 6 (six) hours as needed for nausea. (Patient not taking: Reported on 08/03/2016)  . [DISCONTINUED] sulfamethoxazole-trimethoprim (SEPTRA DS) 800-160 MG per tablet Take 1 tablet by mouth every 12 (twelve) hours. (Patient not taking: Reported on 08/03/2016)   No facility-administered encounter medications on file as of 06/05/2017.    Allergies  Allergen Reactions  . Codeine Hives and Rash   Family History  Problem Relation Age of Onset  . Cancer Mother        Throat  . Hyperlipidemia Father   . Hypertension Father   . Heart disease Father   . Heart attack Father   . Mental illness Father        Panic Attacks  . Mental illness Sister  Panic Attacks  . Cancer Sister        uterine  . Mental illness Son        Bipolar  . Heart disease Maternal Grandmother   . Cancer Maternal Grandfather   . Hyperlipidemia Paternal Grandmother   . Hypertension Paternal Grandmother   . Heart attack Paternal Grandfather   . Hypertension Paternal Grandfather   . Hyperlipidemia Paternal Grandfather   . Mental illness Sister        Panic Attacks   Social History   Social History  . Marital status: Legally Separated    Spouse name: N/A  . Number of children: N/A  . Years of education: N/A   Occupational History  . Not on file.   Social History Main Topics  . Smoking status: Current Every Day Smoker    Packs/day: 2.00  . Smokeless tobacco: Never Used  . Alcohol use Yes     Comment: occassional  . Drug use: No  . Sexual activity: Not Currently    Birth control/ protection: None   Other Topics Concern  . Not on file   Social History Narrative  . No narrative on file    Review  of Systems  Constitutional: Negative.   Respiratory: Negative.   Cardiovascular: Negative.   Musculoskeletal: Positive for arthralgias, back pain, gait problem and myalgias. Negative for joint swelling, neck pain and neck stiffness.  Psychiatric/Behavioral: Positive for agitation, decreased concentration and dysphoric mood. Negative for behavioral problems, confusion, hallucinations, self-injury, sleep disturbance and suicidal ideas. The patient is nervous/anxious. The patient is not hyperactive.     Per HPI unless specifically indicated above     Objective:    BP (!) 168/90 (BP Location: Left Arm, Patient Position: Sitting, Cuff Size: Normal)   Pulse 85   Temp 99.1 F (37.3 C)   Ht 5' 3.2" (1.605 m)   Wt 166 lb 4 oz (75.4 kg)   SpO2 97%   BMI 29.26 kg/m   Wt Readings from Last 3 Encounters:  06/05/17 166 lb 4 oz (75.4 kg)  03/07/17 158 lb (71.7 kg)  08/03/16 154 lb (69.9 kg)    Physical Exam  Constitutional: She is oriented to person, place, and time. She appears well-developed and well-nourished. No distress.  HENT:  Head: Normocephalic and atraumatic.  Right Ear: Hearing normal.  Left Ear: Hearing normal.  Nose: Nose normal.  Eyes: Conjunctivae and lids are normal. Right eye exhibits no discharge. Left eye exhibits no discharge. No scleral icterus.  Cardiovascular: Normal rate, regular rhythm, normal heart sounds and intact distal pulses.  Exam reveals no gallop and no friction rub.   No murmur heard. Pulmonary/Chest: Effort normal and breath sounds normal. No respiratory distress. She has no wheezes. She has no rales. She exhibits no tenderness.  Musculoskeletal: She exhibits edema (trace bilaterally).  Neurological: She is alert and oriented to person, place, and time.  Skin: Skin is warm, dry and intact. No rash noted. She is not diaphoretic. No erythema. No pallor.  Psychiatric: Her behavior is normal. Judgment and thought content normal. Her mood appears anxious. Her  speech is rapid and/or pressured. Cognition and memory are normal.  Back Exam:    Inspection:  Normal spinal curvature.  No deformity, ecchymosis, erythema, or lesions     Palpation:     Midline spinal tenderness: no      Paralumbar tenderness: yes Right     Parathoracic tenderness: no      Buttocks tenderness: yesRight  Range of Motion:      Flexion: Fingers to Knees     Extension:Decreased     Lateral bending:Decreased    Rotation:Decreased    Neuro Exam:Lower extremity DTRs normal & symmetric.  Strength and sensation intact.     Special Tests:      Straight leg raise:negative   Results for orders placed or performed in visit on 06/05/17  [facility] HIV SCREENING LAB  Result Value Ref Range   [facility] HIV Screening Negative - Patient reported   [facility] HEPATITIS C SCREENING LAB  Result Value Ref Range   [facility] Hepatitis Screen Negative - Patient Reported       Assessment & Plan:   Problem List Items Addressed This Visit      Cardiovascular and Mediastinum   Hypertension - Primary    Not under good control. Off meds. Restart HCTZ to help with swelling and recheck 1 month.       Relevant Medications   atorvastatin (LIPITOR) 40 MG tablet   hydrochlorothiazide (HYDRODIURIL) 25 MG tablet   Other Relevant Orders   CBC with Differential/Platelet   Comprehensive metabolic panel   Microalbumin, Urine Waived     Other   Panic attacks    Not under good control. Off meds. Restart celexa and recheck 1 month. Discussed that we do not prescribe controlled substances on the 1st visit. Rx for hydroxyzine given today for symptomatic relief. Await records from previous PCP. List of counselors given today.      Relevant Medications   citalopram (CELEXA) 40 MG tablet   hydrOXYzine (ATARAX/VISTARIL) 25 MG tablet   Other Relevant Orders   CBC with Differential/Platelet   Comprehensive metabolic panel   TSH   Hypercholesteremia    Not under good control. Off meds. Restart statin and recheck 1 month.         Relevant Medications   atorvastatin (LIPITOR) 40 MG tablet   hydrochlorothiazide (HYDRODIURIL) 25 MG tablet   Other Relevant Orders   CBC with Differential/Platelet   Comprehensive metabolic panel   Lipid Panel w/o Chol/HDL Ratio    Other Visit Diagnoses    Screening for breast cancer       Order for mammogram placed today   Relevant Orders   MM DIGITAL SCREENING BILATERAL   Screening for colon cancer       Referral for colonoscopy placed today.   Relevant Orders   Ambulatory referral to Gastroenterology   Chronic right-sided low back pain with right-sided sciatica       Exercises and flexeril given. Will check urine. Await results. Obtain x-ray. Call with any concerns. Recheck 1 month.    Relevant Medications   citalopram (CELEXA) 40 MG tablet   meloxicam (MOBIC) 15 MG tablet   cyclobenzaprine (FLEXERIL) 10 MG tablet   hydrOXYzine (ATARAX/VISTARIL) 25 MG tablet   Other Relevant Orders   UA/M w/rflx Culture, Routine   DG Lumbar Spine Complete   Peripheral edema       Start HCTZ and compression stockings. Referral to vascular made today. Call with any concerns.    Relevant Orders   Ambulatory referral to Vascular Surgery       Follow up plan: Return in about 4 weeks (around 07/03/2017) for Records release from PCP in [place] please.

## 2017-06-05 NOTE — Assessment & Plan Note (Signed)
Not under good control. Off meds. Restart statin and recheck 1 month.

## 2017-06-05 NOTE — Assessment & Plan Note (Addendum)
Not under good control. Off meds. Restart celexa and recheck 1 month. Discussed that we do not prescribe controlled substances on the 1st visit. Rx for hydroxyzine given today for symptomatic relief. Await records from previous PCP. List of counselors given today.

## 2017-06-05 NOTE — Patient Instructions (Addendum)
Sciatica Rehab  Ask your health care provider which exercises are safe for you. Do exercises exactly as told by your health care provider and adjust them as directed. It is normal to feel mild stretching, pulling, tightness, or discomfort as you do these exercises, but you should stop right away if you feel sudden pain or your pain gets worse. Do not begin these exercises until told by your health care provider.  Stretching and range of motion exercises  These exercises warm up your muscles and joints and improve the movement and flexibility of your hips and your back. These exercises also help to relieve pain, numbness, and tingling.  Exercise A: Sciatic nerve glide  1. Sit in a chair with your head facing down toward your chest. Place your hands behind your back. Let your shoulders slump forward.  2. Slowly straighten one of your knees while you tilt your head back as if you are looking toward the ceiling. Only straighten your leg as far as you can without making your symptoms worse.  3. Hold for __________ seconds.  4. Slowly return to the starting position.  5. Repeat with your other leg.  Repeat __________ times. Complete this exercise __________ times a day.  Exercise B: Knee to chest with hip adduction and internal rotation    1. Lie on your back on a firm surface with both legs straight.  2. Bend one of your knees and move it up toward your chest until you feel a gentle stretch in your lower back and buttock. Then, move your knee toward the shoulder that is on the opposite side from your leg.  ? Hold your leg in this position by holding onto the front of your knee.  3. Hold for __________ seconds.  4. Slowly return to the starting position.  5. Repeat with your other leg.  Repeat __________ times. Complete this exercise __________ times a day.  Exercise C: Prone extension on elbows    1. Lie on your abdomen on a firm surface. A bed may be too soft for this exercise.  2. Prop yourself up on your  elbows.  3. Use your arms to help lift your chest up until you feel a gentle stretch in your abdomen and your lower back.  ? This will place some of your body weight on your elbows. If this is uncomfortable, try stacking pillows under your chest.  ? Your hips should stay down, against the surface that you are lying on. Keep your hip and back muscles relaxed.  4. Hold for __________ seconds.  5. Slowly relax your upper body and return to the starting position.  Repeat __________ times. Complete this exercise __________ times a day.  Strengthening exercises  These exercises build strength and endurance in your back. Endurance is the ability to use your muscles for a long time, even after they get tired.  Exercise D: Pelvic tilt  1. Lie on your back on a firm surface. Bend your knees and keep your feet flat.  2. Tense your abdominal muscles. Tip your pelvis up toward the ceiling and flatten your lower back into the floor.  ? To help with this exercise, you may place a small towel under your lower back and try to push your back into the towel.  3. Hold for __________ seconds.  4. Let your muscles relax completely before you repeat this exercise.  Repeat __________ times. Complete this exercise __________ times a day.  Exercise E: Alternating arm and leg raises      1. Get on your hands and knees on a firm surface. If you are on a hard floor, you may want to use padding to cushion your knees, such as an exercise mat.  2. Line up your arms and legs. Your hands should be below your shoulders, and your knees should be below your hips.  3. Lift your left leg behind you. At the same time, raise your right arm and straighten it in front of you.  ? Do not lift your leg higher than your hip.  ? Do not lift your arm higher than your shoulder.  ? Keep your abdominal and back muscles tight.  ? Keep your hips facing the ground.  ? Do not arch your back.  ? Keep your balance carefully, and do not hold your breath.  4. Hold for  __________ seconds.  5. Slowly return to the starting position and repeat with your right leg and your left arm.  Repeat __________ times. Complete this exercise __________ times a day.  Posture and body mechanics    Body mechanics refers to the movements and positions of your body while you do your daily activities. Posture is part of body mechanics. Good posture and healthy body mechanics can help to relieve stress in your body's tissues and joints. Good posture means that your spine is in its natural S-curve position (your spine is neutral), your shoulders are pulled back slightly, and your head is not tipped forward. The following are general guidelines for applying improved posture and body mechanics to your everyday activities.  Standing    · When standing, keep your spine neutral and your feet about hip-width apart. Keep a slight bend in your knees. Your ears, shoulders, and hips should line up.  · When you do a task in which you stand in one place for a long time, place one foot up on a stable object that is 2-4 inches (5-10 cm) high, such as a footstool. This helps keep your spine neutral.  Sitting    · When sitting, keep your spine neutral and keep your feet flat on the floor. Use a footrest, if necessary, and keep your thighs parallel to the floor. Avoid rounding your shoulders, and avoid tilting your head forward.  · When working at a desk or a computer, keep your desk at a height where your hands are slightly lower than your elbows. Slide your chair under your desk so you are close enough to maintain good posture.  · When working at a computer, place your monitor at a height where you are looking straight ahead and you do not have to tilt your head forward or downward to look at the screen.  Resting    · When lying down and resting, avoid positions that are most painful for you.  · If you have pain with activities such as sitting, bending, stooping, or squatting (flexion-based activities), lie in a  position in which your body does not bend very much. For example, avoid curling up on your side with your arms and knees near your chest (fetal position).  · If you have pain with activities such as standing for a long time or reaching with your arms (extension-based activities), lie with your spine in a neutral position and bend your knees slightly. Try the following positions:  ? Lying on your side with a pillow between your knees.  ? Lying on your back with a pillow under your knees.  Lifting    · When lifting   objects, keep your feet at least shoulder-width apart and tighten your abdominal muscles.  · Bend your knees and hips and keep your spine neutral. It is important to lift using the strength of your legs, not your back. Do not lock your knees straight out.  · Always ask for help to lift heavy or awkward objects.  This information is not intended to replace advice given to you by your health care provider. Make sure you discuss any questions you have with your health care provider.  Document Released: 09/23/2005 Document Revised: 05/30/2016 Document Reviewed: 06/09/2015  [employer]vier Interactive Patient Education © 2018 [employer]

## 2017-06-06 ENCOUNTER — Telehealth: Payer: Self-pay | Admitting: Family Medicine

## 2017-06-06 DIAGNOSIS — R319 Hematuria, unspecified: Secondary | ICD-10-CM

## 2017-06-06 LAB — CBC WITH DIFFERENTIAL/PLATELET
Basophils Absolute: 0.1 10*3/uL (ref 0.0–0.2)
Basos: 1 %
EOS (ABSOLUTE): 0.3 10*3/uL (ref 0.0–0.4)
Eos: 2 %
Hematocrit: 44.8 % (ref 34.0–46.6)
Hemoglobin: 15.7 g/dL (ref 11.1–15.9)
Immature Grans (Abs): 0 10*3/uL (ref 0.0–0.1)
Immature Granulocytes: 0 %
Lymphocytes Absolute: 3.5 10*3/uL — ABNORMAL HIGH (ref 0.7–3.1)
Lymphs: 29 %
MCH: 31.8 pg (ref 26.6–33.0)
MCHC: 35 g/dL (ref 31.5–35.7)
MCV: 91 fL (ref 79–97)
Monocytes Absolute: 0.9 10*3/uL (ref 0.1–0.9)
Monocytes: 8 %
Neutrophils Absolute: 7.4 10*3/uL — ABNORMAL HIGH (ref 1.4–7.0)
Neutrophils: 60 %
Platelets: 249 10*3/uL (ref 150–379)
RBC: 4.93 x10E6/uL (ref 3.77–5.28)
RDW: 13.6 % (ref 12.3–15.4)
WBC: 12.2 10*3/uL — ABNORMAL HIGH (ref 3.4–10.8)

## 2017-06-06 LAB — COMPREHENSIVE METABOLIC PANEL
ALT: 16 IU/L (ref 0–32)
AST: 18 IU/L (ref 0–40)
Albumin/Globulin Ratio: 1 — ABNORMAL LOW (ref 1.2–2.2)
Albumin: 2.8 g/dL — ABNORMAL LOW (ref 3.5–5.5)
Alkaline Phosphatase: 106 IU/L (ref 39–117)
BUN/Creatinine Ratio: 30 — ABNORMAL HIGH (ref 9–23)
BUN: 11 mg/dL (ref 6–24)
Bilirubin Total: <0.2 mg/dL (ref 0.0–1.2)
CO2: 23 mmol/L (ref 20–29)
Calcium: 8.9 mg/dL (ref 8.7–10.2)
Chloride: 101 mmol/L (ref 96–106)
Creatinine, Ser: 0.37 mg/dL — ABNORMAL LOW (ref 0.57–1.00)
GFR calc Af Amer: 139 mL/min/{1.73_m2} (ref >59)
GFR calc non Af Amer: 121 mL/min/{1.73_m2} (ref >59)
Globulin, Total: 2.8 g/dL (ref 1.5–4.5)
Glucose: 102 mg/dL — ABNORMAL HIGH (ref 65–99)
Potassium: 4.3 mmol/L (ref 3.5–5.2)
Sodium: 142 mmol/L (ref 134–144)
Total Protein: 5.6 g/dL — ABNORMAL LOW (ref 6.0–8.5)

## 2017-06-06 LAB — UA/M W/RFLX CULTURE, ROUTINE
Bilirubin, UA: NEGATIVE
Glucose, UA: NEGATIVE
Leukocytes, UA: NEGATIVE
Nitrite, UA: NEGATIVE
Specific Gravity, UA: 1.02 (ref 1.005–1.030)
Urobilinogen, Ur: 0.2 mg/dL (ref 0.2–1.0)
pH, UA: 7 (ref 5.0–7.5)

## 2017-06-06 LAB — MICROALBUMIN, URINE WAIVED
Creatinine, Urine Waived: 200 mg/dL (ref 10–300)
Microalb, Ur Waived: 150 mg/L — ABNORMAL HIGH (ref 0–19)
Microalb/Creat Ratio: >300 mg/g — ABNORMAL HIGH (ref <30)

## 2017-06-06 LAB — LIPID PANEL W/O CHOL/HDL RATIO
Cholesterol, Total: 354 mg/dL — ABNORMAL HIGH (ref 100–199)
HDL: 26 mg/dL — ABNORMAL LOW (ref >39)
Triglycerides: 1873 mg/dL (ref 0–149)

## 2017-06-06 LAB — MICROSCOPIC EXAMINATION

## 2017-06-06 LAB — TSH: TSH: 3.9 u[IU]/mL (ref 0.450–4.500)

## 2017-06-06 NOTE — Telephone Encounter (Signed)
Patient notified

## 2017-06-06 NOTE — Telephone Encounter (Signed)
Called patient, no answer, unable to leave a message. Will try again.  

## 2017-06-06 NOTE — Telephone Encounter (Signed)
Please let her know that her urine came back with a lot of blood in it, so I'd really like for her to go get those x-rays so we can make sure this isn't a kidney stone. The rest of her labs came back OK, but her cholesterol was terrible! So she should definitely start taking her medicine again. Thanks!

## 2017-06-24 ENCOUNTER — Encounter (INDEPENDENT_AMBULATORY_CARE_PROVIDER_SITE_OTHER): Payer: Managed Care, Other (non HMO) | Admitting: Vascular Surgery

## 2017-07-03 ENCOUNTER — Ambulatory Visit: Payer: Managed Care, Other (non HMO) | Admitting: Family Medicine

## 2017-07-03 ENCOUNTER — Telehealth: Payer: Self-pay | Admitting: Family Medicine

## 2017-07-03 NOTE — Telephone Encounter (Signed)
Spoke with patient, she would like a refill on her clonazepam, she is going out of town next week.  She does not need a refill on the flexeril.

## 2017-07-03 NOTE — Telephone Encounter (Signed)
Appointment scheduled.

## 2017-07-03 NOTE — Telephone Encounter (Signed)
Needs an appointment.

## 2017-07-04 ENCOUNTER — Ambulatory Visit (INDEPENDENT_AMBULATORY_CARE_PROVIDER_SITE_OTHER): Payer: Managed Care, Other (non HMO) | Admitting: Family Medicine

## 2017-07-04 ENCOUNTER — Ambulatory Visit: Payer: Self-pay | Admitting: Family Medicine

## 2017-07-04 ENCOUNTER — Encounter: Payer: Self-pay | Admitting: Family Medicine

## 2017-07-04 VITALS — BP 142/86 | HR 69 | Wt 150.0 lb

## 2017-07-04 DIAGNOSIS — Z23 Encounter for immunization: Secondary | ICD-10-CM | POA: Diagnosis not present

## 2017-07-04 DIAGNOSIS — F410 Panic disorder [episodic paroxysmal anxiety] without agoraphobia: Secondary | ICD-10-CM

## 2017-07-04 MED ORDER — CLONAZEPAM 0.5 MG PO TABS: 0.5000 mg | ORAL_TABLET | Freq: Two times a day (BID) | ORAL | 0 refills | Status: DC | PRN

## 2017-07-04 MED ORDER — FLUOXETINE HCL 40 MG PO CAPS: 40.0000 mg | ORAL_CAPSULE | Freq: Every day | ORAL | 1 refills | Status: DC

## 2017-07-04 NOTE — Patient Instructions (Signed)
Stop citalopram (Celexa) and start fluoxetine (Prozac) daily

## 2017-07-04 NOTE — Assessment & Plan Note (Addendum)
Will switch SSRIs given poor response and pt reported weight gain on it (weights appear to be all over the place from what I can see). Start prozac, monitor closely. Refill klonopin for rare, prn use. Can use hydroxyzine prn for anxiety as well. Encouraged pt to establish with a counselor.

## 2017-07-04 NOTE — Progress Notes (Signed)
   BP (!) 142/86   Pulse 69   Wt 150 lb (68 kg)   SpO2 99%   BMI 26.40 kg/m    Subjective:    Patient ID: Brittany Jacobson, female    DOB: 04-27-1961, 56 y.o.   MRN: [id number]  HPI: Brittany Jacobson is a 56 y.o. female  Chief Complaint  Patient presents with  . Anxiety  . Medication Refill   Patient presents today for anxiety f/u. Has Panic attacks when driving, has not driven in about 2 years. Was given celexa at previous appt about 2 months ago. Not sure if it's helping much. Takes klonopin rarely as needed, once a week max. Has not sought counseling but plans to in the near future. Denies SI/HI.  Relevant past medical, surgical, family and social history reviewed and updated as indicated. Interim medical history since our last visit reviewed. Allergies and medications reviewed and updated.  Review of Systems  Constitutional: Negative.   HENT: Negative.   Eyes: Negative.   Respiratory: Negative.   Cardiovascular: Negative.   Gastrointestinal: Negative.   Musculoskeletal: Negative.   Skin: Negative.   Neurological: Negative.   Psychiatric/Behavioral: The patient is nervous/anxious.    Per HPI unless specifically indicated above     Objective:    BP (!) 142/86   Pulse 69   Wt 150 lb (68 kg)   SpO2 99%   BMI 26.40 kg/m   Wt Readings from Last 3 Encounters:  07/04/17 150 lb (68 kg)  06/05/17 166 lb 4 oz (75.4 kg)  03/07/17 158 lb (71.7 kg)    Physical Exam  Constitutional: She is oriented to person, place, and time. She appears well-developed and well-nourished. No distress.  HENT:  Head: Atraumatic.  Eyes: Pupils are equal, round, and reactive to light. Conjunctivae are normal.  Neck: Normal range of motion. Neck supple.  Cardiovascular: Normal rate and normal heart sounds.   Pulmonary/Chest: Effort normal and breath sounds normal. No respiratory distress.  Musculoskeletal: Normal range of motion.  Neurological: She is alert and oriented to person, place, and  time.  Skin: Skin is warm and dry.  Psychiatric: She has a normal mood and affect. Her behavior is normal.  Nursing note and vitals reviewed.     Assessment & Plan:   Problem List Items Addressed This Visit      Other   Panic attacks    Will switch SSRIs given poor response and pt reported weight gain on it (weights appear to be all over the place from what I can see). Start prozac, monitor closely. Refill klonopin for rare, prn use. Can use hydroxyzine prn for anxiety as well. Encouraged pt to establish with a counselor.       Relevant Medications   FLUoxetine (PROZAC) 40 MG capsule    Other Visit Diagnoses    Needs flu shot    -  Primary   Relevant Orders   Flu Vaccine QUAD 6+ mos PF IM (Fluarix Quad PF) (Completed)       Follow up plan: Return in about 6 months (around 01/01/2018) for HTN f/u.

## 2017-07-18 ENCOUNTER — Telehealth: Payer: Self-pay

## 2017-07-18 NOTE — Telephone Encounter (Signed)
error 

## 2017-11-26 ENCOUNTER — Other Ambulatory Visit: Payer: Self-pay | Admitting: Family Medicine

## 2017-11-27 NOTE — Telephone Encounter (Signed)
Please get patient scheduled with Apolonio Schneiders for next week.

## 2017-11-27 NOTE — Telephone Encounter (Signed)
Klonopin refill request - controlled substance

## 2017-11-27 NOTE — Telephone Encounter (Signed)
Needs follow up appointment.  

## 2017-12-03 NOTE — Telephone Encounter (Signed)
Pt stated she would call back and schedule an appt. She stated that she has a hard time getting down here but she would check her schedule. She also wanted to know if she could change to a mail order pharmacy.

## 2017-12-10 ENCOUNTER — Encounter: Payer: Self-pay | Admitting: *Deleted

## 2017-12-10 ENCOUNTER — Emergency Department: Payer: Managed Care, Other (non HMO)

## 2017-12-10 ENCOUNTER — Other Ambulatory Visit: Payer: Self-pay

## 2017-12-10 ENCOUNTER — Emergency Department
Admission: EM | Admit: 2017-12-10 | Discharge: 2017-12-10 | Disposition: A | Discharge status: Home / Self Care | Payer: Managed Care, Other (non HMO) | Attending: Emergency Medicine | Admitting: Emergency Medicine

## 2017-12-10 ENCOUNTER — Ambulatory Visit: Payer: Self-pay | Admitting: *Deleted

## 2017-12-10 DIAGNOSIS — F419 Anxiety disorder, unspecified: Secondary | ICD-10-CM | POA: Insufficient documentation

## 2017-12-10 DIAGNOSIS — R290 Tetany: Secondary | ICD-10-CM | POA: Insufficient documentation

## 2017-12-10 DIAGNOSIS — R7989 Other specified abnormal findings of blood chemistry: Secondary | ICD-10-CM | POA: Diagnosis not present

## 2017-12-10 DIAGNOSIS — Z79899 Other long term (current) drug therapy: Secondary | ICD-10-CM | POA: Diagnosis not present

## 2017-12-10 DIAGNOSIS — I10 Essential (primary) hypertension: Secondary | ICD-10-CM | POA: Diagnosis not present

## 2017-12-10 DIAGNOSIS — M24541 Contracture, right hand: Secondary | ICD-10-CM | POA: Diagnosis present

## 2017-12-10 DIAGNOSIS — F17200 Nicotine dependence, unspecified, uncomplicated: Secondary | ICD-10-CM | POA: Diagnosis not present

## 2017-12-10 DIAGNOSIS — R778 Other specified abnormalities of plasma proteins: Secondary | ICD-10-CM

## 2017-12-10 LAB — BASIC METABOLIC PANEL
Anion gap: 8 (ref 5–15)
BUN: 11 mg/dL (ref 6–20)
CO2: 28 mmol/L (ref 22–32)
Calcium: 7.9 mg/dL — ABNORMAL LOW (ref 8.9–10.3)
Chloride: 105 mmol/L (ref 101–111)
Creatinine, Ser: 0.53 mg/dL (ref 0.44–1.00)
GFR calc Af Amer: >60 mL/min (ref >60)
GFR calc non Af Amer: >60 mL/min (ref >60)
Glucose, Bld: 115 mg/dL — ABNORMAL HIGH (ref 65–99)
Potassium: 3.3 mmol/L — ABNORMAL LOW (ref 3.5–5.1)
Sodium: 141 mmol/L (ref 135–145)

## 2017-12-10 LAB — TROPONIN I
Troponin I: 0.06 ng/mL (ref <0.03)
Troponin I: 0.06 ng/mL (ref <0.03)

## 2017-12-10 LAB — CBC
HCT: 44.9 % (ref 35.0–47.0)
Hemoglobin: 14.9 g/dL (ref 12.0–16.0)
MCH: 29.4 pg (ref 26.0–34.0)
MCHC: 33.2 g/dL (ref 32.0–36.0)
MCV: 88.5 fL (ref 80.0–100.0)
Platelets: 241 10*3/uL (ref 150–440)
RBC: 5.07 MIL/uL (ref 3.80–5.20)
RDW: 13.3 % (ref 11.5–14.5)
WBC: 12.5 10*3/uL — ABNORMAL HIGH (ref 3.6–11.0)

## 2017-12-10 LAB — LIPASE, BLOOD: Lipase: 35 U/L (ref 11–51)

## 2017-12-10 LAB — MAGNESIUM: Magnesium: 1.9 mg/dL (ref 1.7–2.4)

## 2017-12-10 LAB — CK: Total CK: 219 U/L (ref 38–234)

## 2017-12-10 MED ORDER — CALCIUM GLUCONATE 500 MG PO TABS: 1.0000 | ORAL_TABLET | Freq: Three times a day (TID) | ORAL | 1 refills | Status: DC

## 2017-12-10 MED ORDER — ASPIRIN 81 MG PO CHEW
324.0000 mg | CHEWABLE_TABLET | Freq: Once | ORAL | Status: AC
  Administered 2017-12-10: 324 mg via ORAL
  Filled 2017-12-10: qty 4

## 2017-12-10 MED ORDER — SODIUM CHLORIDE 0.9 % IV SOLN
1.0000 g | Freq: Once | INTRAVENOUS | Status: AC
  Administered 2017-12-10: 1 g via INTRAVENOUS
  Filled 2017-12-10: qty 10

## 2017-12-10 MED ORDER — LORAZEPAM 1 MG PO TABS
1.0000 mg | ORAL_TABLET | Freq: Once | ORAL | Status: AC
  Administered 2017-12-10: 1 mg via ORAL
  Filled 2017-12-10: qty 1

## 2017-12-10 NOTE — Telephone Encounter (Signed)
Fax from [facility] [employer] [facility]. Medication refill for Brittany Jacobson for Klonopin 0.5mg .  Last seen 07/04/2017.  Appointment needed?

## 2017-12-10 NOTE — ED Provider Notes (Signed)
[facility] Emergency Department Provider Note  ____________________________________________  Time seen: Approximately 5:04 PM  I have reviewed the triage vital signs and the nursing notes.   HISTORY  Chief Complaint Numbness   HPI Brittany Jacobson is a 57 y.o. female the history of anxiety, panic attacks, hypertension, hyperlipidemia, and smoking who presents for evaluation of right hand contraction. Patient reports having had several similar episodes in the past usually lasting 10-15 minutes. This episode is ongoing for several hours. They usually brought on by anxiety. Today she had an argument with her son and was very anxious. Her right hand suddenly became very contracted and she is unable to open her fingers. She went to urgent care and was sent to the emergency room for evaluation. Patient denies chest pain, back pain, abdominal pain, dizziness, jaw or neck pain, shortness of breath, numbness or pain in her right arm or any other extremity. She denies any personal or family history of heart attacks. She denies headache, facial droop, slurred speech, unilateral weakness or numbness.  Past Medical History:  Diagnosis Date  . Agoraphobia   . Anxiety   . Hypercholesteremia   . Hypertension   . Panic attacks   . Panic attacks   . Reflux     Patient Active Problem List   Diagnosis Date Noted  . Panic attacks   . Hypercholesteremia   . Hypertension     No past surgical history on file.  Prior to Admission medications   Medication Sig Start Date End Date Taking? Authorizing Provider  clonazePAM (KLONOPIN) 0.5 MG tablet Take 1 tablet (0.5 mg total) by mouth 2 (two) times daily as needed for anxiety. 07/04/17  Yes Volney American, PA-C  cyclobenzaprine (FLEXERIL) 10 MG tablet Take 1 tablet (10 mg total) by mouth at bedtime. 06/05/17  Yes Johnson, Megan P, DO  meloxicam (MOBIC) 15 MG tablet Take 1 tablet (15 mg total) by mouth every evening.  06/05/17  Yes Johnson, Megan P, DO  Multiple Vitamin (MULTIVITAMIN) tablet Take 1 tablet by mouth daily.   Yes [provider]  Omega-3 Fatty Acids (FISH OIL) 1200 MG CAPS Take 1 capsule by mouth daily.    Yes [provider]  pantoprazole (PROTONIX) 40 MG tablet Take 1 tablet (40 mg total) by mouth every evening. 06/05/17  Yes Johnson, Megan P, DO  atorvastatin (LIPITOR) 40 MG tablet Take 2 tablets (80 mg total) by mouth every evening. 06/05/17   Park Liter P, DO  calcium gluconate 500 MG tablet Take 1 tablet (500 mg total) by mouth 3 (three) times daily. 12/10/17   Rudene Re, MD  FLUoxetine (PROZAC) 40 MG capsule Take 1 capsule (40 mg total) by mouth daily. Patient not taking: Reported on 12/10/2017 07/04/17   Volney American, PA-C  hydrochlorothiazide (HYDRODIURIL) 25 MG tablet Take 1 tablet (25 mg total) by mouth daily. 06/05/17   Johnson, Megan P, DO  hydrOXYzine (ATARAX/VISTARIL) 25 MG tablet Take 1 tablet (25 mg total) by mouth 3 (three) times daily. 06/05/17   Park Liter P, DO    Allergies Codeine  Family History  Problem Relation Age of Onset  . Cancer Mother        Throat  . Hyperlipidemia Father   . Hypertension Father   . Heart disease Father   . Heart attack Father   . Mental illness Father        Panic Attacks  . Mental illness Sister  Panic Attacks  . Cancer Sister        uterine  . Mental illness Son        Bipolar  . Heart disease Maternal Grandmother   . Cancer Maternal Grandfather   . Hyperlipidemia Paternal Grandmother   . Hypertension Paternal Grandmother   . Heart attack Paternal Grandfather   . Hypertension Paternal Grandfather   . Hyperlipidemia Paternal Grandfather   . Mental illness Sister        Panic Attacks    Social History Social History   Tobacco Use  . Smoking status: Current Every Day Smoker    Packs/day: 2.00  . Smokeless tobacco: Never Used  Substance Use Topics  . Alcohol use: Yes    Comment:  occassional  . Drug use: No    Review of Systems  Constitutional: Negative for fever. Eyes: Negative for visual changes. ENT: Negative for sore throat. Neck: No neck pain  Cardiovascular: Negative for chest pain. Respiratory: Negative for shortness of breath. Gastrointestinal: Negative for abdominal pain, vomiting or diarrhea. Genitourinary: Negative for dysuria. Musculoskeletal: Negative for back pain. + R hand contraction Skin: Negative for rash. Neurological: Negative for headaches, weakness or numbness. Psych: No SI or HI  ____________________________________________   PHYSICAL EXAM:  VITAL SIGNS: ED Triage Vitals  Enc Vitals Group     BP 12/10/17 1542 (!) 187/92     Pulse Rate 12/10/17 1542 82     Resp 12/10/17 1542 20     Temp 12/10/17 1542 98.5 F (36.9 C)     Temp Source 12/10/17 1542 Oral     SpO2 12/10/17 1542 97 %     Weight 12/10/17 1543 157 lb (71.2 kg)     Height 12/10/17 1543 5\' 3"  (1.6 m)     Head Circumference --      Peak Flow --      Pain Score --      Pain Loc --      Pain Edu? --      Excl. in [place]? --     Constitutional: Alert and oriented, no apparent distress, sitting in bed holding her R hand with all fingers flexed at the metacarpal phalangeal joint HEENT:      Head: Normocephalic and atraumatic.         Eyes: Conjunctivae are normal. Sclera is non-icteric.       Mouth/Throat: Mucous membranes are moist.       Neck: Supple with no signs of meningismus. Cardiovascular: Regular rate and rhythm. No murmurs, gallops, or rubs. 2+ symmetrical distal pulses are present in all extremities. No JVD. Respiratory: Normal respiratory effort. Lungs are clear to auscultation bilaterally. No wheezes, crackles, or rhonchi.  Gastrointestinal: Soft, non tender, and non distended with positive bowel sounds. No rebound or guarding. Musculoskeletal: Nontender with normal range of motion in all extremities. No edema, cyanosis, or erythema of  extremities. Neurologic: Normal speech and language. A & O x3, PERRL, EOMI, no nystagmus, CN II-XII intact, motor testing reveals good tone and bulk throughout. There is no evidence of pronator drift or dysmetria. Muscle strength is 5/5 throughout, normal grip strength bilaterally. Deep tendon reflexes are 2+ throughout with downgoing toes. Sensory examination is intact. Gait is normal. Skin: Skin is warm, dry and intact. No rash noted. Psychiatric: Mood and affect are normal. Speech and behavior are normal.  ____________________________________________   LABS (all labs ordered are listed, but only abnormal results are displayed)  Labs Reviewed  BASIC METABOLIC PANEL - Abnormal;  Notable for the following components:      Result Value   Potassium 3.3 (*)    Glucose, Bld 115 (*)    Calcium 7.9 (*)    All other components within normal limits  CBC - Abnormal; Notable for the following components:   WBC 12.5 (*)    All other components within normal limits  TROPONIN I - Abnormal; Notable for the following components:   Troponin I 0.06 (*)    All other components within normal limits  TROPONIN I - Abnormal; Notable for the following components:   Troponin I 0.06 (*)    All other components within normal limits  MAGNESIUM  LIPASE, BLOOD  CK   ____________________________________________  EKG  ED ECG REPORT I, Rudene Re, the attending physician, personally viewed and interpreted this ECG.  Normal sinus rhythm, rate of 78, normal intervals, normal axis, no ST elevations or depressions, normal EKG   17:14 - normal sinus rhythm, rate of 82, prolonged QTC, normal axis, no ST elevations or depressions. Prolonged QTC is new compared to initial EKG  19:58 - normal sinus rhythm, rate of 88, normal intervals, normal axis, no ST elevations or depressions. ____________________________________________  RADIOLOGY  I have personally reviewed the images performed during this visit and  I agree with the Radiologist's read.   Interpretation by Radiologist:  Dg Chest 2 View  Result Date: 12/10/2017 CLINICAL DATA:  57 year old female with right hand numbness and spasms EXAM: CHEST - 2 VIEW COMPARISON:  Prior chest x-ray 11/16/2012 FINDINGS: Cardiac and mediastinal contours remain within normal limits. Slight interval progression of chronic bronchitic changes and interstitial prominence. Stable linear pleuroparenchymal scarring in the lingula. No overt pulmonary edema, pleural effusion or pneumothorax. No acute osseous abnormality. IMPRESSION: Slight interval progression of chronic bronchitic changes and interstitial prominence compared to 2014. Otherwise, no acute cardiopulmonary disease. Electronically Signed   By: Jacqulynn Cadet M.D.   On: 12/10/2017 17:41      ____________________________________________   PROCEDURES  Procedure(s) performed: None Procedures Critical Care performed:  None ____________________________________________   INITIAL IMPRESSION / ASSESSMENT AND PLAN / ED COURSE   57 y.o. female the history of anxiety, panic attacks, hypertension, hyperlipidemia, and smoking who presents for evaluation of right hand contraction. Patient is seemingly in bed holding the fingers of the right hand flexed at the metacarpal phalangeal joint with increased muscle tone concerning for tetany. She is able to open her hand with significant amount of effort, she has normal showing grip, she is otherwise completely neurologically intact. She reports similar episodes on and off for over a year. It seems like these episodes are usually brought on by some type of anxiety or panic attack. She received 1 mg of Ativan by mouth in the waiting room and does report some improvement. Her calcium is slightly low at 7.9, will supplement. Negative Trousseau and [place] signs. The most concerning part of her presentation is however the fact that her troponin is slightly elevated at 0.06. She  has no prior cardiac history and denies chest pain, shortness of breath, dizziness, abdominal pain, or back pain. Her EKG shows no signs of ischemia. She has strong distal pulses and normal sensation. With no CP/back pain, normal strong pulses do not believe this is a dissection. I will repeat an EKG to eval for ischemic changes, will get CXR to eval for widened mediastinum. Will give ASA. Will get 2nd troponin. Will get CK to rule out rhabdo.  Clinical Course as of Dec 11 2003  Wed Dec 10, 2017  1958 Second troponin is unchanged at 0.06. Patient remains with no CP, SOB, back pain, abdominal pain, dizziness. Unclear etiology of this elevated troponin but with no symptoms of heart attack, serial normal EKGs, and unchanged delta 3 troponin, I feel patient is safe for dc home and patient is requesting to be discharged. If patient had any evidence of ischemia I would have expected that troponin would have changed in 3 hours.  Patient's tetany resolved with IV calcium. Thrid EKG showing resolution of prolonged QTc and no evidence of dysrhythmia or ischemia. Will provide prescription for calcium supplementation. Recommended close f/u with PCP.   [CV]    Clinical Course User Index [CV] Alfred Levins [place], MD     As part of my medical decision making, I reviewed the following data within the [place] notes reviewed and incorporated, Labs reviewed , EKG interpreted , Old EKG reviewed, Radiograph reviewed , Notes from prior ED visits and [place] Controlled Substance Database    Pertinent labs & imaging results that were available during my care of the patient were reviewed by me and considered in my medical decision making (see chart for details).    ____________________________________________   FINAL CLINICAL IMPRESSION(S) / ED DIAGNOSES  Final diagnoses:  Tetany  Hypocalcemia  Elevated troponin      NEW MEDICATIONS STARTED DURING THIS VISIT:  ED Discharge Orders         Ordered    calcium gluconate 500 MG tablet  3 times daily     12/10/17 2002       Note:  This document was prepared using Dragon voice recognition software and may include unintentional dictation errors.    Alfred Levins, [place], MD 12/10/17 2007

## 2017-12-10 NOTE — ED Triage Notes (Signed)
Pt sent from urgent care for eval of right hand numbness and spasms.  Pt also reports similar sx in left hand earlier today.  Pt denies h/a  No slurred speech or diff eating or drinking.  Pt alert   Speech clear.

## 2017-12-10 NOTE — Telephone Encounter (Signed)
Patient reports she has numbness in both hands and right arm. Patient states she feels this is associated with her panic attacks. She states she has had numbness in her hands before- but today they are closed and she can't open them. She states this happened after she got upset today- advised patient she needs to be seen and evaluated- advised ED- patient states she is going to UC- because if she goes to ED she will get a big bill. Told patient if UC decides she has something they can't evaluate- they may send her. She will call to let her PCP know what happens.  Reason for Disposition . Patient sounds very sick or weak to the triager  Answer Assessment - Initial Assessment Questions 1. SYMPTOM: "What is the main symptom you are concerned about?" (e.g., weakness, numbness)     Hands are closed and patient reports her arms are getting numb- started on right and to the left 2. ONSET: "When did this start?" (minutes, hours, days; while sleeping)     Today- off.on not as severe 3. LAST NORMAL: "When was the last time you were normal (no symptoms)?"     Off/on - not severe  Patient reports that it has happened before- but it always goes away- today not going away 4. PATTERN "Does this come and go, or has it been constant since it started?"  "Is it present now?"     Comes and goes 5. CARDIAC SYMPTOMS: "Have you had any of the following symptoms: chest pain, difficulty breathing, palpitations?"     no 6. NEUROLOGIC SYMPTOMS: "Have you had any of the following symptoms: headache, dizziness, vision loss, double vision, changes in speech, unsteady on your feet?"     no 7. OTHER SYMPTOMS: "Do you have any other symptoms?"     precipitating factor- patient got upset 8. PREGNANCY: "Is there any chance you are pregnant?" "When was your last menstrual period?"     n/a  Protocols used: NEUROLOGIC DEFICIT-A-AH

## 2017-12-10 NOTE — ED Notes (Signed)
FN: pt sent over from [facility] for further eval of right hand numbness and tingling.

## 2017-12-10 NOTE — Discharge Instructions (Signed)
Follow-up with primary care doctor for further evaluation of low levels of calcium. Take supplementation as prescribed. Follow-up with cardiology for evaluation of elevated cardiac enzyme. Return to the emergency room if you have chest pain, shortness of breath, dizziness.

## 2017-12-10 NOTE — ED Notes (Signed)
Per dr Burlene Arnt, no ct scan at this time.  Pt alert.

## 2017-12-10 NOTE — ED Notes (Addendum)
Per radiology tech, pt refused chest xray and said it was not necessary. Will discontinue order.

## 2017-12-11 ENCOUNTER — Telehealth: Payer: Self-pay | Admitting: Family Medicine

## 2017-12-11 NOTE — Telephone Encounter (Signed)
I cannot give her a note as I didn't see her. The ER should have given her a note.

## 2017-12-11 NOTE — Telephone Encounter (Signed)
Copied from [place]. Topic: General - Other >> Dec 11, 2017  3:10 PM Carolyn Stare wrote:  Pt is asking for a call back today  Pt call to ask for a note to be out of work tomorrow for being exhausted she was in the ED at [facility]   [phone]

## 2017-12-11 NOTE — Telephone Encounter (Signed)
Patient notified

## 2017-12-11 NOTE — Telephone Encounter (Signed)
Routing to provider  

## 2017-12-11 NOTE — Telephone Encounter (Signed)
Noted  

## 2017-12-11 NOTE — Telephone Encounter (Signed)
Patient called to let provider know what happened at the hospital yesterday. Attempted to call patient back- mailbox is full and can not leave message.

## 2017-12-11 NOTE — Telephone Encounter (Signed)
Pt requesting a note to be out of work tomorrow, 12/12/17.  States she is exhausted as she was in the ED at [facility] yesterday.   #ph: [phone]

## 2017-12-12 ENCOUNTER — Telehealth: Payer: Self-pay | Admitting: Family Medicine

## 2017-12-12 NOTE — Telephone Encounter (Signed)
Copied from [place]. Topic: Quick Communication - See Telephone Encounter >> Dec 12, 2017  9:26 AM Ether Griffins B wrote: CRM for notification. See Telephone encounter for:  Pt was seen in the emergency room and prescribed calcium gluconate 500 MG tablet. The pharmacy doesn't have the MG in stock could she purchase this OTC. And is there a certain brand or type she needs to look for. Pt also inquiring about baby aspirin and if she should take this?  12/12/17.

## 2017-12-12 NOTE — Telephone Encounter (Signed)
Routing to provider  

## 2017-12-12 NOTE — Telephone Encounter (Signed)
She can take that medicine over the counter. I will discuss taking baby aspirin with her at her follow up.

## 2017-12-12 NOTE — Telephone Encounter (Signed)
Patient notified, appointment scheduled

## 2017-12-19 ENCOUNTER — Ambulatory Visit (INDEPENDENT_AMBULATORY_CARE_PROVIDER_SITE_OTHER): Payer: Managed Care, Other (non HMO) | Admitting: Family Medicine

## 2017-12-19 ENCOUNTER — Encounter: Payer: Self-pay | Admitting: Family Medicine

## 2017-12-19 VITALS — BP 159/70 | HR 85 | Temp 98.5°F | Wt 162.8 lb

## 2017-12-19 DIAGNOSIS — E7800 Pure hypercholesterolemia, unspecified: Secondary | ICD-10-CM

## 2017-12-19 DIAGNOSIS — I10 Essential (primary) hypertension: Secondary | ICD-10-CM | POA: Diagnosis not present

## 2017-12-19 DIAGNOSIS — R290 Tetany: Secondary | ICD-10-CM

## 2017-12-19 DIAGNOSIS — F410 Panic disorder [episodic paroxysmal anxiety] without agoraphobia: Secondary | ICD-10-CM | POA: Diagnosis not present

## 2017-12-19 LAB — MICROSCOPIC EXAMINATION

## 2017-12-19 LAB — UA/M W/RFLX CULTURE, ROUTINE
Bilirubin, UA: NEGATIVE
Glucose, UA: NEGATIVE
Ketones, UA: NEGATIVE
Leukocytes, UA: NEGATIVE
Nitrite, UA: NEGATIVE
Specific Gravity, UA: 1.025 (ref 1.005–1.030)
Urobilinogen, Ur: 0.2 mg/dL (ref 0.2–1.0)
pH, UA: 6 (ref 5.0–7.5)

## 2017-12-19 LAB — MICROALBUMIN, URINE WAIVED
Creatinine, Urine Waived: 200 mg/dL (ref 10–300)
Microalb, Ur Waived: 150 mg/L — ABNORMAL HIGH (ref 0–19)

## 2017-12-19 MED ORDER — CLONAZEPAM 0.5 MG PO TABS: 0.5000 mg | ORAL_TABLET | Freq: Two times a day (BID) | ORAL | 0 refills | Status: DC | PRN

## 2017-12-19 MED ORDER — PAROXETINE HCL 20 MG PO TABS: ORAL_TABLET | ORAL | 1 refills | Status: DC

## 2017-12-19 MED ORDER — ATORVASTATIN CALCIUM 40 MG PO TABS: 80.0000 mg | ORAL_TABLET | Freq: Every evening | ORAL | 1 refills | Status: DC

## 2017-12-19 MED ORDER — HYDROCHLOROTHIAZIDE 25 MG PO TABS: 25.0000 mg | ORAL_TABLET | Freq: Every day | ORAL | 1 refills | Status: DC

## 2017-12-19 MED ORDER — MELOXICAM 15 MG PO TABS
15.0000 mg | ORAL_TABLET | Freq: Every evening | ORAL | 1 refills | Status: DC
Start: 2017-12-19 — End: 2018-10-05

## 2017-12-19 MED ORDER — PANTOPRAZOLE SODIUM 40 MG PO TBEC: 40.0000 mg | DELAYED_RELEASE_TABLET | ORAL | 1 refills | Status: DC

## 2017-12-19 NOTE — Progress Notes (Signed)
BP (!) 159/70 (BP Location: Left Arm, Cuff Size: Normal)   Pulse 85   Temp 98.5 F (36.9 C) (Oral)   Wt 162 lb 12.8 oz (73.8 kg)   SpO2 97%   BMI 28.84 kg/m    Subjective:    Patient ID: Brittany Jacobson, female    DOB: 09-19-1961, 57 y.o.   MRN: [id number]  HPI: Brittany Jacobson is a 57 y.o. female who presents today after being lost to follow up since September of 2018  Chief Complaint  Patient presents with  . 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 She notes that it has been happening many years ago. She notes that she has never been worked up for this before. It usually gets better with anxiety medicine. It usually goes away within 15-20 minutes. This last time it lasted about 7-8 hours. She is not able to move her fingers apart. She notes that if she pulls it apart it goes right back. Even when she was asleep, her hand would not relax.  ANXIETY/STRESS- did not take the prozac after last time as she was afraid of the side effects. Notes that she often feels like she is "coming down off a drug" not dizzy, not able to describe it more detail. She is constantly   Duration:uncontrolled Anxious mood: yes  Excessive worrying: yes Irritability: yes  Sweating: yes Nausea: yes Palpitations:yes Hyperventilation: yes Panic attacks: yes Agoraphobia: yes  Obscessions/compulsions: yes Depressed mood: yes Depression screen [facility] 2/9 12/19/2017 06/05/2017  Decreased Interest 1 3  Down, Depressed, Hopeless 1 3  PHQ - 2 Score 2 6  Altered sleeping 1 3  Tired, decreased energy 1 3    Change in appetite 1 3  Feeling bad or failure about yourself  1 3  Trouble concentrating 0 3  Moving slowly or fidgety/restless 0 3  Suicidal thoughts 0 0  PHQ-9 Score 6 24  Difficult doing work/chores Very difficult Very difficult   GAD 7 : Generalized Anxiety Score 12/19/2017 06/05/2017  Nervous, Anxious, on Edge 2 3  Control/stop worrying 0 3  Worry too much - different things 1 3  Trouble relaxing 1 3  Restless 0 3  Easily annoyed or irritable 0 3  Afraid - awful might happen 0 3  Total GAD 7 Score 4 21  Anxiety Difficulty Extremely difficult Extremely difficult   Anhedonia: no Weight changes: no Insomnia: yes hard to stay asleep  Hypersomnia: no Fatigue/loss of energy: yes Feelings of worthlessness: no Feelings of guilt: no Impaired concentration/indecisiveness: no Suicidal ideations: no  Crying spells: yes Recent Stressors/Life Changes: yes   Relationship problems: yes   Family stress: yes     Financial stress: yes    Job stress: no    Recent death/loss: no   HYPERTENSION / HYPERLIPIDEMIA Satisfied with current treatment? no Duration of hypertension: chronic BP monitoring frequency: not checking BP medication side effects: no Past BP meds: hctz Duration of hyperlipidemia: chronic Cholesterol medication side effects: no Cholesterol supplements: none Past cholesterol medications: atorvastatin Medication compliance: fair compliance Aspirin: no Recent stressors: yes Recurrent headaches: no Visual  changes: no Palpitations: no Dyspnea: no Chest pain: no Lower extremity edema: no Dizzy/lightheaded: yes   Relevant past medical, surgical, family and social history reviewed and updated as indicated. Interim medical history since our last visit reviewed. Allergies and medications reviewed and updated.  Review of Systems  Constitutional: Negative.   Respiratory: Positive for shortness of breath. Negative for apnea, cough, choking, chest tightness, wheezing  and stridor.   Cardiovascular: Positive for palpitations. Negative for chest pain and leg swelling.  Gastrointestinal: Positive for diarrhea, nausea and vomiting. Negative for abdominal distention, abdominal pain, anal bleeding, blood in stool, constipation and rectal pain.  Psychiatric/Behavioral: Positive for dysphoric mood and sleep disturbance (On and off- maybe getting 5 hours every night, in bed from about 9-9 AM, waking up repeatedly). Negative for agitation, behavioral problems, confusion, decreased concentration, hallucinations, self-injury and suicidal ideas. The patient is nervous/anxious. The patient is not hyperactive.     Per HPI unless specifically indicated above     Objective:    BP (!) 159/70 (BP Location: Left Arm, Cuff Size: Normal)   Pulse 85   Temp 98.5 F (36.9 C) (Oral)   Wt 162 lb 12.8 oz (73.8 kg)   SpO2 97%   BMI 28.84 kg/m   Wt Readings from Last 3 Encounters:  12/19/17 162 lb 12.8 oz (73.8 kg)  12/10/17 157 lb (71.2 kg)  07/04/17 150 lb (68 kg)    Physical Exam  Constitutional: She is oriented to person, place, and time. She appears well-developed and well-nourished. No distress.  HENT:  Head: Normocephalic and atraumatic.  Right Ear: Hearing normal.  Left Ear: Hearing normal.  Nose: Nose normal.  Eyes: Conjunctivae and lids are normal. Right eye exhibits no discharge. Left eye exhibits no discharge. No scleral icterus.  Cardiovascular: Normal rate, regular rhythm, normal heart sounds and intact distal pulses. Exam reveals no gallop and no friction rub.  No murmur heard. Pulmonary/Chest: Effort normal and breath sounds normal. No respiratory distress. She has no wheezes. She has no rales. She exhibits no tenderness.  Musculoskeletal: Normal range of motion.  Neurological: She is alert and oriented to person, place, and time.  Skin: Skin is warm, dry and intact. No rash noted. She is not diaphoretic. No erythema. No pallor.  Psychiatric: She has a  normal mood and affect. Her speech is normal and behavior is normal. Judgment and thought content normal. Cognition and memory are normal.  Nursing note and vitals reviewed.   Results for orders placed or performed during the hospital encounter of 94/85/46  Basic metabolic panel  Result Value Ref Range   Sodium 141 135 - 145 mmol/L   Potassium 3.3 (L) 3.5 - 5.1 mmol/L   Chloride 105 101 - 111 mmol/L   CO2 28 22 - 32 mmol/L   Glucose, Bld 115 (H) 65 - 99 mg/dL   BUN 11 6 - 20 mg/dL   Creatinine, Ser 0.53 0.44 - 1.00 mg/dL   Calcium 7.9 (L) 8.9 - 10.3 mg/dL   GFR calc non Af Amer >60 >60 mL/min   GFR calc Af Amer >60 >60 mL/min   Anion gap 8 5 - 15  CBC  Result Value Ref Range   WBC 12.5 (H) 3.6 - 11.0 K/uL   RBC 5.07 3.80 - 5.20 MIL/uL   Hemoglobin 14.9 12.0 - 16.0 g/dL   HCT 44.9 35.0 - 47.0 %   MCV 88.5 80.0 - 100.0 fL   MCH 29.4 26.0 - 34.0 pg   MCHC  33.2 32.0 - 36.0 g/dL   RDW 13.3 11.5 - 14.5 %   Platelets 241 150 - 440 K/uL  Troponin I  Result Value Ref Range   Troponin I 0.06 (HH) <0.03 ng/mL  Magnesium  Result Value Ref Range   Magnesium 1.9 1.7 - 2.4 mg/dL  Lipase, blood  Result Value Ref Range   Lipase 35 11 - 51 U/L  CK  Result Value Ref Range   Total CK 219 38 - 234 U/L  Troponin I  Result Value Ref Range   Troponin I 0.06 (HH) <0.03 ng/mL      Assessment & Plan:   Problem List Items Addressed This Visit      Cardiovascular and Mediastinum   Hypertension - Primary    Not under good control. Possibly due to anxiety. Will get her anxiety treated and recheck in 2-3 weeks. Call with any concerns.       Relevant Medications   hydrochlorothiazide (HYDRODIURIL) 25 MG tablet   atorvastatin (LIPITOR) 40 MG tablet   Other Relevant Orders   CBC with Differential/Platelet   Comprehensive metabolic panel   Microalbumin, Urine Waived   UA/M w/rflx Culture, Routine     Nervous and Auditory   Tetany    Checking labs today. Has never had work up. Continues  even in sleep. Will get her into see neurology for evaluation. Call with any concerns.       Relevant Orders   Ambulatory referral to Neurology     Other   Panic attacks    Not under good control. Not taking anything preventative. Liked paxil in the past. Will restart paxil and recheck in 2-3 weeks. Refill of klonopin given today. Continue to monitor. Call with any concerns.       Relevant Medications   PARoxetine (PAXIL) 20 MG tablet   Other Relevant Orders   CBC with Differential/Platelet   Comprehensive metabolic panel   Thyroid Panel With TSH   UA/M w/rflx Culture, Routine   Hypercholesteremia    Rechecking levels today. Call with any concerns. Will adjust dose as needed.       Relevant Medications   hydrochlorothiazide (HYDRODIURIL) 25 MG tablet   atorvastatin (LIPITOR) 40 MG tablet   Other Relevant Orders   CBC with Differential/Platelet   Comprehensive metabolic panel   Lipid Panel w/o Chol/HDL Ratio   UA/M w/rflx Culture, Routine       Follow up plan: Return 2-3 weeks, for follow up mood.

## 2017-12-19 NOTE — Patient Instructions (Signed)
If you are not feeling safe, Call this number:  [phone]

## 2017-12-19 NOTE — Assessment & Plan Note (Signed)
Rechecking levels today. Call with any concerns. Will adjust dose as needed.  

## 2017-12-19 NOTE — Assessment & Plan Note (Signed)
Not under good control. Possibly due to anxiety. Will get her anxiety treated and recheck in 2-3 weeks. Call with any concerns.

## 2017-12-19 NOTE — Assessment & Plan Note (Signed)
Checking labs today. Has never had work up. Continues even in sleep. Will get her into see neurology for evaluation. Call with any concerns.

## 2017-12-19 NOTE — Assessment & Plan Note (Signed)
Not under good control. Not taking anything preventative. Liked paxil in the past. Will restart paxil and recheck in 2-3 weeks. Refill of klonopin given today. Continue to monitor. Call with any concerns.

## 2017-12-20 LAB — COMPREHENSIVE METABOLIC PANEL
ALT: 53 IU/L — ABNORMAL HIGH (ref 0–32)
AST: 47 IU/L — ABNORMAL HIGH (ref 0–40)
Albumin/Globulin Ratio: 1.2 (ref 1.2–2.2)
Albumin: 3 g/dL — ABNORMAL LOW (ref 3.5–5.5)
Alkaline Phosphatase: 106 IU/L (ref 39–117)
BUN/Creatinine Ratio: 27 — ABNORMAL HIGH (ref 9–23)
BUN: 16 mg/dL (ref 6–24)
Bilirubin Total: 0.4 mg/dL (ref 0.0–1.2)
CO2: 26 mmol/L (ref 20–29)
Calcium: 8.2 mg/dL — ABNORMAL LOW (ref 8.7–10.2)
Chloride: 101 mmol/L (ref 96–106)
Creatinine, Ser: 0.6 mg/dL (ref 0.57–1.00)
GFR calc Af Amer: 119 mL/min/{1.73_m2} (ref >59)
GFR calc non Af Amer: 103 mL/min/{1.73_m2} (ref >59)
Globulin, Total: 2.5 g/dL (ref 1.5–4.5)
Glucose: 104 mg/dL — ABNORMAL HIGH (ref 65–99)
Potassium: 3.9 mmol/L (ref 3.5–5.2)
Sodium: 143 mmol/L (ref 134–144)
Total Protein: 5.5 g/dL — ABNORMAL LOW (ref 6.0–8.5)

## 2017-12-20 LAB — CBC WITH DIFFERENTIAL/PLATELET
Basophils Absolute: 0 10*3/uL (ref 0.0–0.2)
Basos: 0 %
EOS (ABSOLUTE): 0.2 10*3/uL (ref 0.0–0.4)
Eos: 2 %
Hematocrit: 43.1 % (ref 34.0–46.6)
Hemoglobin: 15.4 g/dL (ref 11.1–15.9)
Immature Grans (Abs): 0 10*3/uL (ref 0.0–0.1)
Immature Granulocytes: 0 %
Lymphocytes Absolute: 2.6 10*3/uL (ref 0.7–3.1)
Lymphs: 22 %
MCH: 30.4 pg (ref 26.6–33.0)
MCHC: 35.7 g/dL (ref 31.5–35.7)
MCV: 85 fL (ref 79–97)
Monocytes Absolute: 0.9 10*3/uL (ref 0.1–0.9)
Monocytes: 8 %
Neutrophils Absolute: 8 10*3/uL — ABNORMAL HIGH (ref 1.4–7.0)
Neutrophils: 68 %
Platelets: 235 10*3/uL (ref 150–379)
RBC: 5.06 x10E6/uL (ref 3.77–5.28)
RDW: 13.8 % (ref 12.3–15.4)
WBC: 11.8 10*3/uL — ABNORMAL HIGH (ref 3.4–10.8)

## 2017-12-20 LAB — THYROID PANEL WITH TSH
Free Thyroxine Index: 1.3 (ref 1.2–4.9)
T3 Uptake Ratio: 22 % — ABNORMAL LOW (ref 24–39)
T4, Total: 5.7 ug/dL (ref 4.5–12.0)
TSH: 4.35 u[IU]/mL (ref 0.450–4.500)

## 2017-12-20 LAB — LIPID PANEL W/O CHOL/HDL RATIO
Cholesterol, Total: 228 mg/dL — ABNORMAL HIGH (ref 100–199)
HDL: 40 mg/dL (ref >39)
LDL Calculated: 111 mg/dL — ABNORMAL HIGH (ref 0–99)
Triglycerides: 387 mg/dL — ABNORMAL HIGH (ref 0–149)
VLDL Cholesterol Cal: 77 mg/dL — ABNORMAL HIGH (ref 5–40)

## 2017-12-22 ENCOUNTER — Encounter: Payer: Self-pay | Admitting: Family Medicine

## 2017-12-23 ENCOUNTER — Encounter: Payer: Self-pay | Admitting: Neurology

## 2018-01-08 NOTE — Progress Notes (Deleted)
Brittany Jacobson was seen today in neurologic consultation at the request of [name], DO.  The consultation is for the evaluation of "tetany."  The records that were made available to me were reviewed.  Patient was in the emergency room on December 10, 2017 with complaints of episodes of right arm contraction that lasted 10-15 minutes.  The episode that brought her to the emergency room, however, lasted for several hours.  The episodes are brought on by anxiety, and today she presented to the emergency room she had an argument with her son.  The patient is a 57 y.o. year old female with a history of ***.  The first symptom began *** years ago.   Neuroimaging has *** previously been performed.  It *** available for my review today.  PREVIOUS MEDICATIONS: ***  ALLERGIES:   Allergies  Allergen Reactions  . Codeine Hives and Rash    CURRENT MEDICATIONS:  Outpatient Encounter Medications as of 01/13/2018  Medication Sig  . atorvastatin (LIPITOR) 40 MG tablet Take 2 tablets (80 mg total) by mouth every evening. For cholesterol  . calcium citrate-vitamin D (CITRACAL+D) 315-200 MG-UNIT tablet Take 1 tablet by mouth 2 (two) times daily.  . calcium gluconate 500 MG tablet Take 1 tablet (500 mg total) by mouth 3 (three) times daily.  . clonazePAM (KLONOPIN) 0.5 MG tablet Take 1 tablet (0.5 mg total) by mouth 2 (two) times daily as needed for anxiety.  . hydrochlorothiazide (HYDRODIURIL) 25 MG tablet Take 1 tablet (25 mg total) by mouth daily. For blood pressure  . meloxicam (MOBIC) 15 MG tablet Take 1 tablet (15 mg total) by mouth every evening. As needed for joint pain or muscle pain  . Multiple Vitamin (MULTIVITAMIN) tablet Take 1 tablet by mouth daily.  . Omega-3 Fatty Acids (FISH OIL) 1200 MG CAPS Take 1 capsule by mouth daily.   . pantoprazole (PROTONIX) 40 MG tablet Take 1 tablet (40 mg total) by mouth every morning. As needed for heartburn  . PARoxetine (PAXIL) 20 MG tablet 1/2 tab daily  for 2 weeks, then increase to 1 tab daily for prevention of anxiety   No facility-administered encounter medications on file as of 01/13/2018.     PAST MEDICAL HISTORY:   Past Medical History:  Diagnosis Date  . Agoraphobia   . Anxiety   . Hypercholesteremia   . Hypertension   . Panic attacks   . Panic attacks   . Reflux     PAST SURGICAL HISTORY:  No past surgical history on file.  SOCIAL HISTORY:   Social History   Socioeconomic History  . Marital status: Legally Separated    Spouse name: Not on file  . Number of children: Not on file  . Years of education: Not on file  . Highest education level: Not on file  Occupational History  . Not on file  Social Needs  . Financial resource strain: Not on file  . Food insecurity:    Worry: Not on file    Inability: Not on file  . Transportation needs:    Medical: Not on file    Non-medical: Not on file  Tobacco Use  . Smoking status: Current Every Day Smoker    Packs/day: 2.00  . Smokeless tobacco: Never Used  Substance and Sexual Activity  . Alcohol use: Yes    Comment: occassional  . Drug use: No  . Sexual activity: Not Currently    Birth control/protection: None  Lifestyle  . Physical  activity:    Days per week: Not on file    Minutes per session: Not on file  . Stress: Not on file  Relationships  . Social connections:    Talks on phone: Not on file    Gets together: Not on file    Attends religious service: Not on file    Active member of club or organization: Not on file    Attends meetings of clubs or organizations: Not on file    Relationship status: Not on file  . Intimate partner violence:    Fear of current or ex partner: Not on file    Emotionally abused: Not on file    Physically abused: Not on file    Forced sexual activity: Not on file  Other Topics Concern  . Not on file  Social History Narrative  . Not on file    FAMILY HISTORY:   Family Status  Relation Name Status  . Mother  Deceased    . Father  Alive  . Sister [employer]  . Daughter  Alive  . Son  Alive  . MGM  Deceased  . MGF  Deceased  . PGM  Deceased  . PGF  Deceased  . Sister  Alive    ROS:  A complete 10 system review of systems was obtained and was unremarkable apart from what is mentioned above.  PHYSICAL EXAMINATION:    VITALS:  There were no vitals filed for this visit.  GEN:  Normal appears female in no acute distress.  Appears stated age. HEENT:  Normocephalic, atraumatic. The mucous membranes are moist. The superficial temporal arteries are without ropiness or tenderness. Cardiovascular: Regular rate and rhythm. Lungs: Clear to auscultation bilaterally. Neck/Heme: There are no carotid bruits noted bilaterally.  NEUROLOGICAL: Orientation:  The patient is alert and oriented x 3.  Fund of knowledge is appropriate.  Recent and remote memory intact.  Attention span and concentration normal.  Repeats and names without difficulty. Cranial nerves: There is good facial symmetry. The pupils are equal round and reactive to light bilaterally. Fundoscopic exam reveals clear disc margins bilaterally. Extraocular muscles are intact and visual fields are full to confrontational testing. Speech is fluent and clear. Soft palate rises symmetrically and there is no tongue deviation. Hearing is intact to conversational tone. Tone: Tone is good throughout. Sensation: Sensation is intact to light touch and pinprick throughout (facial, trunk, extremities). Vibration is intact at the bilateral big toe. There is no extinction with double simultaneous stimulation. There is no sensory dermatomal level identified. Coordination:  The patient has no difficulty with RAM's or FNF bilaterally. Motor: Strength is 5/5 in the bilateral upper and lower extremities.  Shoulder shrug is equal and symmetric. There is no pronator drift.  There are no fasciculations noted. DTR's: Deep tendon reflexes are 2/4 at the bilateral biceps, triceps,  brachioradialis, patella and achilles.  Plantar responses are downgoing bilaterally. Gait and Station: The patient is able to ambulate without difficulty. The patient is able to heel toe walk without any difficulty. The patient is able to ambulate in a tandem fashion. The patient is able to stand in the Romberg position.   IMPRESSION/PLAN  1. ***   Cc:  Johnson, Megan P, DO

## 2018-01-13 ENCOUNTER — Ambulatory Visit: Payer: 59 | Admitting: Neurology

## 2018-04-01 ENCOUNTER — Telehealth: Payer: Self-pay | Admitting: Family Medicine

## 2018-04-01 NOTE — Telephone Encounter (Signed)
Copied from [place] [id number]. Topic: Quick Communication - Rx Refill/Question >> Apr 01, 2018  6:52 AM Celedonio Savage L wrote: Medication: clonazePAM (KLONOPIN) 0.5 MG tablet  PARoxetine (PAXIL) 20 MG tablet  Has the patient contacted their pharmacy? Yes.   (Agent: If no, request that the patient contact the pharmacy for the refill.) (Agent: If yes, when and what did the pharmacy advise?)  Preferred Pharmacy (with phone number or street name): [facility] [address] [place] - [place] [phone] (Phone) [phone] (Fax)      Agent: Please be advised that RX refills may take up to 3 business days. We ask that you follow-up with your pharmacy.

## 2018-04-01 NOTE — Telephone Encounter (Signed)
I do not recommend any detoxes. They don't work and they can be dangerous.

## 2018-04-01 NOTE — Telephone Encounter (Signed)
Patient notified

## 2018-04-01 NOTE — Telephone Encounter (Signed)
Needs an appointment. Will not get refill until she comes to appointment.

## 2018-04-01 NOTE — Telephone Encounter (Signed)
Copied from [place]. Topic: General - Other >> Apr 01, 2018  6:49 AM Yvette Rack wrote: Reason for CRM: pt calling wanting to know what cleanser  and  water pill can she take for detox   >> Apr 01, 2018  6:56 AM Yvette Rack wrote: [facility] if she doesn't answer if you have any questions >> Apr 01, 2018  8:45 AM Shaune Pollack wrote: See question from patient. Please advise. thanks

## 2018-05-27 ENCOUNTER — Telehealth: Payer: Self-pay | Admitting: Family Medicine

## 2018-05-27 NOTE — Telephone Encounter (Signed)
Needs appt, was supposed to have followed up back in April and never did. Was told she couldn't get refills back in June until coming in for an appt and again never came back.

## 2018-05-27 NOTE — Telephone Encounter (Signed)
Scheduled w/ Adriana for Monday, FYI.

## 2018-05-27 NOTE — Telephone Encounter (Signed)
Copied from [place] [id number]. Topic: Inquiry >> May 27, 2018 11:03 AM Cecelia Byars, NT wrote: Reason for CRM: Patient called and said her insurance will run out the end of this month ,and would like to know if she can get a 6 month refill on all of her medications and she would also like like to know if she can go thru a mail order pharmacy ? Please call her at [phone]

## 2018-06-01 ENCOUNTER — Ambulatory Visit (INDEPENDENT_AMBULATORY_CARE_PROVIDER_SITE_OTHER): Payer: Managed Care, Other (non HMO) | Admitting: Physician Assistant

## 2018-06-01 ENCOUNTER — Encounter: Payer: Self-pay | Admitting: Physician Assistant

## 2018-06-01 VITALS — BP 143/90 | HR 80 | Temp 98.8°F | Ht 63.0 in | Wt 161.2 lb

## 2018-06-01 DIAGNOSIS — I10 Essential (primary) hypertension: Secondary | ICD-10-CM | POA: Diagnosis not present

## 2018-06-01 DIAGNOSIS — E7800 Pure hypercholesterolemia, unspecified: Secondary | ICD-10-CM

## 2018-06-01 DIAGNOSIS — F410 Panic disorder [episodic paroxysmal anxiety] without agoraphobia: Secondary | ICD-10-CM | POA: Diagnosis not present

## 2018-06-01 MED ORDER — ATORVASTATIN CALCIUM 40 MG PO TABS: 80.0000 mg | ORAL_TABLET | Freq: Every evening | ORAL | 0 refills | Status: DC

## 2018-06-01 MED ORDER — PAROXETINE HCL 30 MG PO TABS: 30.0000 mg | ORAL_TABLET | Freq: Every day | ORAL | 0 refills | Status: DC

## 2018-06-01 NOTE — Progress Notes (Signed)
Subjective:    Patient ID: Brittany Jacobson, female    DOB: [birth year], 57 y.o.   MRN: [id number]  Brittany Jacobson is a 57 y.o. female presenting on 06/01/2018 for Medication Refill (atorvastatin, HCTZ, and klonopin)   HPI   HTN: currently taking HCTZ 25 mg daily. Denies chest pain, SOB, headaches.  BP Readings from Last 3 Encounters:  06/01/18 (!) 143/90  12/19/17 (!) 159/70  12/10/17 (!) 163/87   HLD: lipitor 80 mg daily. Ran out but says she was taking it the past three weeks.   Anxiety: Has been on paxil 20 mg x 3 weeks. Reports she hasn't noticed much of a difference. Asking for refill of klonopin. She has gotten this in the past.   Social History   Tobacco Use  . Smoking status: Current Every Day Smoker    Packs/day: 2.00  . Smokeless tobacco: Never Used  Substance Use Topics  . Alcohol use: Yes    Comment: occassional  . Drug use: No    Review of Systems Per HPI unless specifically indicated above     Objective:    BP (!) 143/90   Pulse 80   Temp 98.8 F (37.1 C) (Oral)   Ht '5\' 3"'  (1.6 m)   Wt 161 lb 3.2 oz (73.1 kg)   SpO2 98%   BMI 28.56 kg/m   Wt Readings from Last 3 Encounters:  06/01/18 161 lb 3.2 oz (73.1 kg)  12/19/17 162 lb 12.8 oz (73.8 kg)  12/10/17 157 lb (71.2 kg)    Physical Exam  Constitutional: She is oriented to person, place, and time. She appears well-developed and well-nourished.  Cardiovascular: Normal rate and regular rhythm.  Pulmonary/Chest: Effort normal and breath sounds normal.  Abdominal: Soft. Bowel sounds are normal.  Neurological: She is alert and oriented to person, place, and time.  Skin: Skin is warm and dry.  Psychiatric: She has a normal mood and affect. Her behavior is normal.   Results for orders placed or performed in visit on 06/01/18  Comp Met (CMET)  Result Value Ref Range   Glucose 113 (H) 65 - 99 mg/dL   BUN 19 6 - 24 mg/dL   Creatinine, Ser 0.60 0.57 - 1.00 mg/dL   GFR calc non Af Amer 102 >59  mL/min/1.73   GFR calc Af Amer 118 >59 mL/min/1.73   BUN/Creatinine Ratio 32 (H) 9 - 23   Sodium 142 134 - 144 mmol/L   Potassium 3.4 (L) 3.5 - 5.2 mmol/L   Chloride 102 96 - 106 mmol/L   CO2 24 20 - 29 mmol/L   Calcium 9.0 8.7 - 10.2 mg/dL   Total Protein 5.3 (L) 6.0 - 8.5 g/dL   Albumin 2.8 (L) 3.5 - 5.5 g/dL   Globulin, Total 2.5 1.5 - 4.5 g/dL   Albumin/Globulin Ratio 1.1 (L) 1.2 - 2.2   Bilirubin Total 0.2 0.0 - 1.2 mg/dL   Alkaline Phosphatase 94 39 - 117 IU/L   AST 14 0 - 40 IU/L   ALT 15 0 - 32 IU/L  Lipid Profile  Result Value Ref Range   Cholesterol, Total 289 (H) 100 - 199 mg/dL   Triglycerides 876 (HH) 0 - 149 mg/dL   HDL 29 (L) >39 mg/dL   VLDL Cholesterol Cal Comment 5 - 40 mg/dL   LDL Calculated Comment 0 - 99 mg/dL   Chol/HDL Ratio 10.0 (H) 0.0 - 4.4 ratio     Office Visit from 12/19/2017 in [facility]  PHQ-9 Total Score  6         Assessment & Plan:  1. Essential hypertension  Uncontrolled today. Will change HCTZ 25 mg to Lisinopril 10-12.5 mg daily. Have her follow up in one month for office visit and repeat labs.   - Comp Met (CMET)  2. Hypercholesteremia  Her cholesterol is uncontrolled. Recommend she switch to crestor 40 mg as she is on lipitor 80 mg.   - Lipid Profile - atorvastatin (LIPITOR) 40 MG tablet; Take 2 tablets (80 mg total) by mouth every evening. For cholesterol  Dispense: 180 tablet; Refill: 0  3. Panic attacks  Increase to 30 mg daily. Refused to fill Klonopin at this visit. She is noncompliant with follow up.   - PARoxetine (PAXIL) 30 MG tablet; Take 1 tablet (30 mg total) by mouth daily.  Dispense: 90 tablet; Refill: 0    Follow up plan: Return in about 1 month (around 07/02/2018).  Carles Collet, PA-C [facility] 06/03/2018, 11:56 AM

## 2018-06-01 NOTE — Patient Instructions (Addendum)
Will send in lisinopril-hctz after labworks    Hypertension Hypertension is another name for high blood pressure. High blood pressure forces your heart to work harder to pump blood. This can cause problems over time. There are two numbers in a blood pressure reading. There is a top number (systolic) over a bottom number (diastolic). It is best to have a blood pressure below 120/80. Healthy choices can help lower your blood pressure. You may need medicine to help lower your blood pressure if:  Your blood pressure cannot be lowered with healthy choices.  Your blood pressure is higher than 130/80.  Follow these instructions at home: Eating and drinking  If directed, follow the DASH eating plan. This diet includes: ? Filling half of your plate at each meal with fruits and vegetables. ? Filling one quarter of your plate at each meal with whole grains. Whole grains include whole wheat pasta, brown rice, and whole grain bread. ? Eating or drinking low-fat dairy products, such as skim milk or low-fat yogurt. ? Filling one quarter of your plate at each meal with low-fat (lean) proteins. Low-fat proteins include fish, skinless chicken, eggs, beans, and tofu. ? Avoiding fatty meat, cured and processed meat, or chicken with skin. ? Avoiding premade or processed food.  Eat less than 1,500 mg of salt (sodium) a day.  Limit alcohol use to no more than 1 drink a day for nonpregnant women and 2 drinks a day for men. One drink equals 12 oz of beer, 5 oz of wine, or 1 oz of hard liquor. Lifestyle  Work with your doctor to stay at a healthy weight or to lose weight. Ask your doctor what the best weight is for you.  Get at least 30 minutes of exercise that causes your heart to beat faster (aerobic exercise) most days of the week. This may include walking, swimming, or biking.  Get at least 30 minutes of exercise that strengthens your muscles (resistance exercise) at least 3 days a week. This may include  lifting weights or pilates.  Do not use any products that contain nicotine or tobacco. This includes cigarettes and e-cigarettes. If you need help quitting, ask your doctor.  Check your blood pressure at home as told by your doctor.  Keep all follow-up visits as told by your doctor. This is important. Medicines  Take over-the-counter and prescription medicines only as told by your doctor. Follow directions carefully.  Do not skip doses of blood pressure medicine. The medicine does not work as well if you skip doses. Skipping doses also puts you at risk for problems.  Ask your doctor about side effects or reactions to medicines that you should watch for. Contact a doctor if:  You think you are having a reaction to the medicine you are taking.  You have headaches that keep coming back (recurring).  You feel dizzy.  You have swelling in your ankles.  You have trouble with your vision. Get help right away if:  You get a very bad headache.  You start to feel confused.  You feel weak or numb.  You feel faint.  You get very bad pain in your: ? Chest. ? Belly (abdomen).  You throw up (vomit) more than once.  You have trouble breathing. Summary  Hypertension is another name for high blood pressure.  Making healthy choices can help lower blood pressure. If your blood pressure cannot be controlled with healthy choices, you may need to take medicine. This information is not intended to  replace advice given to you by your health care provider. Make sure you discuss any questions you have with your health care provider. Document Released: 03/11/2008 Document Revised: 08/21/2016 Document Reviewed: 08/21/2016 Elsevier Interactive Patient Education  [employer].

## 2018-06-02 LAB — COMPREHENSIVE METABOLIC PANEL
ALT: 15 IU/L (ref 0–32)
AST: 14 IU/L (ref 0–40)
Albumin/Globulin Ratio: 1.1 — ABNORMAL LOW (ref 1.2–2.2)
Albumin: 2.8 g/dL — ABNORMAL LOW (ref 3.5–5.5)
Alkaline Phosphatase: 94 IU/L (ref 39–117)
BUN/Creatinine Ratio: 32 — ABNORMAL HIGH (ref 9–23)
BUN: 19 mg/dL (ref 6–24)
Bilirubin Total: 0.2 mg/dL (ref 0.0–1.2)
CO2: 24 mmol/L (ref 20–29)
Calcium: 9 mg/dL (ref 8.7–10.2)
Chloride: 102 mmol/L (ref 96–106)
Creatinine, Ser: 0.6 mg/dL (ref 0.57–1.00)
GFR calc Af Amer: 118 mL/min/{1.73_m2} (ref >59)
GFR calc non Af Amer: 102 mL/min/{1.73_m2} (ref >59)
Globulin, Total: 2.5 g/dL (ref 1.5–4.5)
Glucose: 113 mg/dL — ABNORMAL HIGH (ref 65–99)
Potassium: 3.4 mmol/L — ABNORMAL LOW (ref 3.5–5.2)
Sodium: 142 mmol/L (ref 134–144)
Total Protein: 5.3 g/dL — ABNORMAL LOW (ref 6.0–8.5)

## 2018-06-02 LAB — LIPID PANEL
Chol/HDL Ratio: 10 ratio — ABNORMAL HIGH (ref 0.0–4.4)
Cholesterol, Total: 289 mg/dL — ABNORMAL HIGH (ref 100–199)
HDL: 29 mg/dL — ABNORMAL LOW (ref >39)
Triglycerides: 876 mg/dL (ref 0–149)

## 2018-06-03 MED ORDER — LISINOPRIL-HYDROCHLOROTHIAZIDE 10-12.5 MG PO TABS: 1.0000 | ORAL_TABLET | Freq: Every day | ORAL | 0 refills | Status: DC

## 2018-06-03 MED ORDER — ROSUVASTATIN CALCIUM 40 MG PO TABS: 40.0000 mg | ORAL_TABLET | Freq: Every day | ORAL | 0 refills | Status: DC

## 2018-07-31 ENCOUNTER — Other Ambulatory Visit: Payer: Self-pay | Admitting: Physician Assistant

## 2018-07-31 ENCOUNTER — Telehealth: Payer: Self-pay | Admitting: Family Medicine

## 2018-07-31 DIAGNOSIS — I10 Essential (primary) hypertension: Secondary | ICD-10-CM

## 2018-07-31 NOTE — Telephone Encounter (Signed)
Needs follow up appointment.  

## 2018-07-31 NOTE — Telephone Encounter (Signed)
rx has already been pended for provider review.

## 2018-07-31 NOTE — Telephone Encounter (Signed)
Copied from [employer] [id number]. Topic: Quick Communication - Rx Refill/Question >> Jul 31, 2018  8:28 AM Burchel, Abbi R wrote: Medication: lisinopril-hydrochlorothiazide (PRINZIDE,ZESTORETIC) 10-12.5 MG tablet   Preferred [place], [place] - [address] [place] [address] Phone: [phone] Fax: [phone]   Please note, pt is completely out of this medication.  Pt was advised that RX refills may take up to 3 business days. We ask that you follow-up with your pharmacy.

## 2018-08-03 ENCOUNTER — Other Ambulatory Visit: Payer: Self-pay | Admitting: Family Medicine

## 2018-08-03 ENCOUNTER — Encounter: Payer: Self-pay | Admitting: Family Medicine

## 2018-08-03 DIAGNOSIS — I10 Essential (primary) hypertension: Secondary | ICD-10-CM

## 2018-08-03 MED ORDER — LISINOPRIL-HYDROCHLOROTHIAZIDE 10-12.5 MG PO TABS: 1.0000 | ORAL_TABLET | Freq: Every day | ORAL | 0 refills | Status: DC

## 2018-08-03 NOTE — Telephone Encounter (Signed)
LVM for pt to schedule an appt.

## 2018-08-03 NOTE — Telephone Encounter (Signed)
Also printed letter to mail.

## 2018-08-03 NOTE — Telephone Encounter (Signed)
Pt has an appt 08/20/18 and would like to know if she can have enough medication to get to that appt.

## 2018-08-20 ENCOUNTER — Ambulatory Visit: Payer: Managed Care, Other (non HMO) | Admitting: Family Medicine

## 2018-10-05 ENCOUNTER — Ambulatory Visit (INDEPENDENT_AMBULATORY_CARE_PROVIDER_SITE_OTHER): Payer: Self-pay | Admitting: Family Medicine

## 2018-10-05 ENCOUNTER — Encounter: Payer: Self-pay | Admitting: Family Medicine

## 2018-10-05 VITALS — BP 195/93 | HR 81 | Temp 98.7°F | Wt 160.4 lb

## 2018-10-05 DIAGNOSIS — F410 Panic disorder [episodic paroxysmal anxiety] without agoraphobia: Secondary | ICD-10-CM

## 2018-10-05 DIAGNOSIS — I10 Essential (primary) hypertension: Secondary | ICD-10-CM

## 2018-10-05 DIAGNOSIS — E7800 Pure hypercholesterolemia, unspecified: Secondary | ICD-10-CM

## 2018-10-05 MED ORDER — MELOXICAM 15 MG PO TABS: 15.0000 mg | ORAL_TABLET | Freq: Every evening | ORAL | 1 refills | Status: DC

## 2018-10-05 MED ORDER — PAROXETINE HCL 30 MG PO TABS: 30.0000 mg | ORAL_TABLET | Freq: Every day | ORAL | 0 refills | Status: DC

## 2018-10-05 MED ORDER — CLONAZEPAM 0.5 MG PO TABS: 0.5000 mg | ORAL_TABLET | Freq: Two times a day (BID) | ORAL | 0 refills | Status: DC | PRN

## 2018-10-05 MED ORDER — ATORVASTATIN CALCIUM 40 MG PO TABS: 40.0000 mg | ORAL_TABLET | Freq: Every day | ORAL | 3 refills | Status: DC

## 2018-10-05 MED ORDER — LISINOPRIL-HYDROCHLOROTHIAZIDE 20-25 MG PO TABS: 1.0000 | ORAL_TABLET | Freq: Every day | ORAL | 3 refills | Status: DC

## 2018-10-05 NOTE — Assessment & Plan Note (Signed)
Under very poor control. Will restart lisinopril-hctz and increase lisinopril to 20mg . Recheck 1 month with labs. Await results.

## 2018-10-05 NOTE — Progress Notes (Signed)
BP (!) 195/93 (BP Location: Left Arm, Cuff Size: Normal)   Pulse 81   Temp 98.7 F (37.1 C) (Oral)   Wt 160 lb 6.4 oz (72.8 kg)   SpO2 97%   BMI 28.41 kg/m    Subjective:    Patient ID: Brittany Jacobson, female    DOB: 29-Nov-1960, 57 y.o.   MRN: [id number]  HPI: Brittany Jacobson is a 57 y.o. female  Chief Complaint  Patient presents with  . Depression  . Hyperlipidemia  . Hypertension   HYPERTENSION / HYPERLIPIDEMIA- has been off all her medicine for over a month. Has lost her insurance. Did not keep her appointments Satisfied with current treatment? no Duration of hypertension: chronic BP monitoring frequency: not checking BP medication side effects: no Past BP meds: lisinopril-hctz Duration of hyperlipidemia: chronic Cholesterol medication side effects: no Cholesterol supplements: none Past cholesterol medications: crestor, atorvastatin Medication compliance: poor compliance Aspirin: no Recent stressors: yes Recurrent headaches: no Visual changes: no Palpitations: no Dyspnea: no Chest pain: no Lower extremity edema: no Dizzy/lightheaded: no  ANXIETY/DEPRESSION Duration:exacerbated Anxious mood: yes  Excessive worrying: yes Irritability: yes  Sweating: yes Nausea: no Palpitations:no Hyperventilation: no Panic attacks: no Agoraphobia: no  Obscessions/compulsions: no Depressed mood: yes Depression screen [facility] 2/9 10/05/2018 12/19/2017 06/05/2017  Decreased Interest 0 1 3  Down, Depressed, Hopeless '1 1 3  ' PHQ - 2 Score '1 2 6  ' Altered sleeping '1 1 3  ' Tired, decreased energy '3 1 3  ' Change in appetite 0 1 3  Feeling bad or failure about yourself  '1 1 3  ' Trouble concentrating 0 0 3  Moving slowly or fidgety/restless 0 0 3  Suicidal thoughts 0 0 0  PHQ-9 Score '6 6 24  ' Difficult doing work/chores - Very difficult Very difficult   GAD 7 : Generalized Anxiety Score 10/05/2018 12/19/2017 06/05/2017  Nervous, Anxious, on Edge '1 2 3  ' Control/stop worrying 0 0 3    Worry too much - different things '1 1 3  ' Trouble relaxing '1 1 3  ' Restless 0 0 3  Easily annoyed or irritable 1 0 3  Afraid - awful might happen 0 0 3  Total GAD 7 Score '4 4 21  ' Anxiety Difficulty Not difficult at all Extremely difficult Extremely difficult   Anhedonia: no Weight changes: no Insomnia: yes   Hypersomnia: yes Fatigue/loss of energy: yes Feelings of worthlessness: yes Feelings of guilt: yes Impaired concentration/indecisiveness: yes Suicidal ideations: no  Crying spells: yes Recent Stressors/Life Changes: yes   Relationship problems: yes   Family stress: yes     Financial stress: yes    Job stress: yes    Recent death/loss: no   Relevant past medical, surgical, family and social history reviewed and updated as indicated. Interim medical history since our last visit reviewed. Allergies and medications reviewed and updated.  Review of Systems  Constitutional: Negative.   Respiratory: Negative.   Cardiovascular: Negative.   Skin: Negative.   Psychiatric/Behavioral: Positive for dysphoric mood. Negative for agitation, behavioral problems, confusion, decreased concentration, hallucinations, self-injury, sleep disturbance and suicidal ideas. The patient is nervous/anxious. The patient is not hyperactive.     Per HPI unless specifically indicated above     Objective:    BP (!) 195/93 (BP Location: Left Arm, Cuff Size: Normal)   Pulse 81   Temp 98.7 F (37.1 C) (Oral)   Wt 160 lb 6.4 oz (72.8 kg)   SpO2 97%   BMI 28.41 kg/m   Wt  Readings from Last 3 Encounters:  10/05/18 160 lb 6.4 oz (72.8 kg)  06/01/18 161 lb 3.2 oz (73.1 kg)  12/19/17 162 lb 12.8 oz (73.8 kg)    Physical Exam Vitals signs and nursing note reviewed.  Constitutional:      General: She is not in acute distress.    Appearance: Normal appearance. She is not ill-appearing, toxic-appearing or diaphoretic.  HENT:     Head: Normocephalic and atraumatic.     Right Ear: External ear  normal.     Left Ear: External ear normal.     Nose: Nose normal.     Mouth/Throat:     Mouth: Mucous membranes are moist.     Pharynx: Oropharynx is clear.  Eyes:     General: No scleral icterus.       Right eye: No discharge.        Left eye: No discharge.     Extraocular Movements: Extraocular movements intact.     Conjunctiva/sclera: Conjunctivae normal.     Pupils: Pupils are equal, round, and reactive to light.  Neck:     Musculoskeletal: Normal range of motion and neck supple.  Cardiovascular:     Rate and Rhythm: Normal rate and regular rhythm.     Pulses: Normal pulses.     Heart sounds: Normal heart sounds. No murmur. No friction rub. No gallop.   Pulmonary:     Effort: Pulmonary effort is normal. No respiratory distress.     Breath sounds: Normal breath sounds. No stridor. No wheezing, rhonchi or rales.  Chest:     Chest wall: No tenderness.  Musculoskeletal: Normal range of motion.  Skin:    General: Skin is warm and dry.     Capillary Refill: Capillary refill takes less than 2 seconds.     Coloration: Skin is not jaundiced or pale.     Findings: No bruising, erythema, lesion or rash.  Neurological:     General: No focal deficit present.     Mental Status: She is alert and oriented to person, place, and time. Mental status is at baseline.  Psychiatric:        Mood and Affect: Mood normal.        Behavior: Behavior normal.        Thought Content: Thought content normal.        Judgment: Judgment normal.     Results for orders placed or performed in visit on 06/01/18  Comp Met (CMET)  Result Value Ref Range   Glucose 113 (H) 65 - 99 mg/dL   BUN 19 6 - 24 mg/dL   Creatinine, Ser 0.60 0.57 - 1.00 mg/dL   GFR calc non Af Amer 102 >59 mL/min/1.73   GFR calc Af Amer 118 >59 mL/min/1.73   BUN/Creatinine Ratio 32 (H) 9 - 23   Sodium 142 134 - 144 mmol/L   Potassium 3.4 (L) 3.5 - 5.2 mmol/L   Chloride 102 96 - 106 mmol/L   CO2 24 20 - 29 mmol/L   Calcium 9.0  8.7 - 10.2 mg/dL   Total Protein 5.3 (L) 6.0 - 8.5 g/dL   Albumin 2.8 (L) 3.5 - 5.5 g/dL   Globulin, Total 2.5 1.5 - 4.5 g/dL   Albumin/Globulin Ratio 1.1 (L) 1.2 - 2.2   Bilirubin Total 0.2 0.0 - 1.2 mg/dL   Alkaline Phosphatase 94 39 - 117 IU/L   AST 14 0 - 40 IU/L   ALT 15 0 - 32 IU/L  Lipid Profile  Result Value Ref Range   Cholesterol, Total 289 (H) 100 - 199 mg/dL   Triglycerides 876 (HH) 0 - 149 mg/dL   HDL 29 (L) >39 mg/dL   VLDL Cholesterol Cal Comment 5 - 40 mg/dL   LDL Calculated Comment 0 - 99 mg/dL   Chol/HDL Ratio 10.0 (H) 0.0 - 4.4 ratio      Assessment & Plan:   Problem List Items Addressed This Visit      Cardiovascular and Mediastinum   Hypertension    Under very poor control. Will restart lisinopril-hctz and increase lisinopril to 73m. Recheck 1 month with labs. Await results.       Relevant Medications   atorvastatin (LIPITOR) 40 MG tablet   lisinopril-hydrochlorothiazide (PRINZIDE,ZESTORETIC) 20-25 MG tablet     Other   Panic attacks - Primary    Stable. Had some of her klonopin from April still. Will restart her paxil. Refill of klonopin given. Should last at least 3-6 months. Call with any concerns. Recheck 1 month at physical.      Relevant Medications   PARoxetine (PAXIL) 30 MG tablet   Hypercholesteremia    Cannot afford crestor due to having no insurance. Cannot afford 87matorvastatin ($76/3 month supply) Will restart her atorvastatin 4064mnd recheck in 1 month. Call with any concerns.       Relevant Medications   atorvastatin (LIPITOR) 40 MG tablet   lisinopril-hydrochlorothiazide (PRINZIDE,ZESTORETIC) 20-25 MG tablet       Follow up plan: Return in about 4 weeks (around 11/02/2018) for Physical.

## 2018-10-05 NOTE — Assessment & Plan Note (Signed)
Stable. Had some of her klonopin from April still. Will restart her paxil. Refill of klonopin given. Should last at least 3-6 months. Call with any concerns. Recheck 1 month at physical.

## 2018-10-05 NOTE — Assessment & Plan Note (Signed)
Cannot afford crestor due to having no insurance. Cannot afford 80mg  atorvastatin ($76/3 month supply) Will restart her atorvastatin 40mg  and recheck in 1 month. Call with any concerns.

## 2018-12-23 ENCOUNTER — Ambulatory Visit: Payer: Self-pay | Admitting: Nurse Practitioner

## 2018-12-23 ENCOUNTER — Ambulatory Visit: Payer: Self-pay | Admitting: *Deleted

## 2018-12-23 NOTE — Telephone Encounter (Signed)
Summary: cough, runny nose    Patient called with complaints of cough, runny nose and diarrhea. Patient is requesting call back from nurse to discuss OTC medications/advice. No travel/ no contact.      Patient is complaining of cough and congestion for about 1 week. Patient reports diarrhea for 2 days.Patient has been using over the counter treatment and she is not getting better.  Reason for Disposition . [1] Continuous (nonstop) coughing interferes with work or school AND [2] no improvement using cough treatment per Care Advice  Answer Assessment - Initial Assessment Questions 1. ONSET: "When did the cough begin?"      1 week 2. SEVERITY: "How bad is the cough today?"      About the same 3. RESPIRATORY DISTRESS: "Describe your breathing."      Not effecting breathing 4. FEVER: "Do you have a fever?" If so, ask: "What is your temperature, how was it measured, and when did it start?"     No fever 5. SPUTUM: "Describe the color of your sputum" (clear, white, yellow, green)     White creamy/clear 6. HEMOPTYSIS: "Are you coughing up any blood?" If so ask: "How much?" (flecks, streaks, tablespoons, etc.)     no 7. CARDIAC HISTORY: "Do you have any history of heart disease?" (e.g., heart attack, congestive heart failure)      no 8. LUNG HISTORY: "Do you have any history of lung disease?"  (e.g., pulmonary embolus, asthma, emphysema)     no 9. PE RISK FACTORS: "Do you have a history of blood clots?" (or: recent major surgery, recent prolonged travel, bedridden)     no 10. OTHER SYMPTOMS: "Do you have any other symptoms?" (e.g., runny nose, wheezing, chest pain)       Some wheezing at night 11. PREGNANCY: "Is there any chance you are pregnant?" "When was your last menstrual period?"       n/a 12. TRAVEL: "Have you traveled out of the country in the last month?" (e.g., travel history, exposures)       no  Protocols used: [place]

## 2018-12-25 ENCOUNTER — Ambulatory Visit (INDEPENDENT_AMBULATORY_CARE_PROVIDER_SITE_OTHER): Payer: Self-pay | Admitting: Nurse Practitioner

## 2018-12-25 ENCOUNTER — Encounter: Payer: Self-pay | Admitting: Nurse Practitioner

## 2018-12-25 ENCOUNTER — Other Ambulatory Visit: Payer: Self-pay

## 2018-12-25 VITALS — BP 118/76 | HR 77 | Temp 98.7°F | Ht 63.0 in | Wt 155.0 lb

## 2018-12-25 DIAGNOSIS — Z1211 Encounter for screening for malignant neoplasm of colon: Secondary | ICD-10-CM

## 2018-12-25 DIAGNOSIS — J0100 Acute maxillary sinusitis, unspecified: Secondary | ICD-10-CM | POA: Insufficient documentation

## 2018-12-25 DIAGNOSIS — Z1239 Encounter for other screening for malignant neoplasm of breast: Secondary | ICD-10-CM

## 2018-12-25 MED ORDER — AMOXICILLIN-POT CLAVULANATE 875-125 MG PO TABS: 1.0000 | ORAL_TABLET | Freq: Two times a day (BID) | ORAL | 0 refills | Status: AC

## 2018-12-25 MED ORDER — FLUTICASONE PROPIONATE 50 MCG/ACT NA SUSP: 2.0000 | Freq: Every day | NASAL | 6 refills | Status: DC

## 2018-12-25 MED ORDER — BENZONATATE 200 MG PO CAPS: 200.0000 mg | ORAL_CAPSULE | Freq: Two times a day (BID) | ORAL | 0 refills | Status: DC | PRN

## 2018-12-25 NOTE — Assessment & Plan Note (Signed)
Acute.  Scripts for Augmentin, Tessalon, and Flonase sent.  Recommend rest and increase hydration at home + humidifier and sinus rinses as needed.  May take OTC Coricidin for symptom management and daily Claritin or Zyrtec for sinus drainage as needed.  Return for worsening or continues symptoms.

## 2018-12-25 NOTE — Patient Instructions (Signed)

## 2018-12-25 NOTE — Progress Notes (Signed)
BP 118/76   Pulse 77   Temp 98.7 F (37.1 C) (Oral)   Ht _0  (1.6 m)   Wt 155 lb (70.3 kg)   SpO2 96%   BMI 27.46 kg/m    Subjective:    Patient ID: Brittany Jacobson, female    DOB: [birth year], 58 y.o.   MRN: [id number]  HPI: Brittany Jacobson is a 58 y.o. female  Chief Complaint  Patient presents with  . Cough    x over a week. tried OTC mucinex  . Diarrhea  . Fatigue   UPPER RESPIRATORY TRACT INFECTION Started one and half weeks ago.  Started with cough, nonproductive.  Diarrhea started 3 days ago, no nausea or vomiting.  Having about 4 episodes of diarrhea a day.  Denies abdominal pain.   Worst symptom: cough Fever: no Cough: yes Shortness of breath: no Wheezing: yes Chest pain: no Chest tightness: yes Chest congestion: yes Nasal congestion: yes Runny nose: yes Post nasal drip: yes Sneezing: yes Sore throat: no Swollen glands: no Sinus pressure: yes Headache: no Face pain: no Toothache: no Ear pain: none Ear pressure: none Eyes red/itching:no Eye drainage/crusting: no  Vomiting: no Rash: no Fatigue: yes Sick contacts: yes, works at [employer] Strep contacts: no  Context: fluctuating Recurrent sinusitis: no Relief with OTC cold/cough medications: no  Treatments attempted: mucinex   Relevant past medical, surgical, family and social history reviewed and updated as indicated. Interim medical history since our last visit reviewed. Allergies and medications reviewed and updated.  Review of Systems  Constitutional: Positive for fatigue. Negative for activity change, appetite change, diaphoresis and fever.  HENT: Positive for congestion, postnasal drip, rhinorrhea and sneezing. Negative for ear discharge, ear pain, facial swelling, sinus pressure, sinus pain, sore throat and voice change.   Eyes: Negative for pain and visual disturbance.  Respiratory: Positive for cough and chest tightness. Negative for shortness of breath and wheezing.    Cardiovascular: Negative for chest pain, palpitations and leg swelling.  Gastrointestinal: Negative for abdominal distention, abdominal pain, constipation, diarrhea, nausea and vomiting.  Endocrine: Negative.   Musculoskeletal: Negative for myalgias.  Neurological: Negative for dizziness, numbness and headaches.  Psychiatric/Behavioral: Negative.     Per HPI unless specifically indicated above     Objective:    BP 118/76   Pulse 77   Temp 98.7 F (37.1 C) (Oral)   Ht _1  (1.6 m)   Wt 155 lb (70.3 kg)   SpO2 96%   BMI 27.46 kg/m   Wt Readings from Last 3 Encounters:  12/25/18 155 lb (70.3 kg)  10/05/18 160 lb 6.4 oz (72.8 kg)  06/01/18 161 lb 3.2 oz (73.1 kg)    Physical Exam Vitals signs and nursing note reviewed.  Constitutional:      General: She is awake.     Appearance: She is well-developed. She is ill-appearing.  HENT:     Head: Normocephalic. No raccoon eyes.     Right Ear: Hearing, ear canal and external ear normal. No drainage. A middle ear effusion is present.     Left Ear: Hearing, ear canal and external ear normal. No drainage. A middle ear effusion is present.     Nose: Mucosal edema and rhinorrhea present. Rhinorrhea is purulent.     Right Sinus: Maxillary sinus tenderness present. No frontal sinus tenderness.     Left Sinus: Maxillary sinus tenderness present. No frontal sinus tenderness.     Mouth/Throat:     Mouth:  Mucous membranes are moist.     Pharynx: Posterior oropharyngeal erythema (mild with cobblestoning) present. No pharyngeal swelling or oropharyngeal exudate.     Tonsils: 0 on the right. 0 on the left.  Eyes:     General:        Right eye: No discharge.        Left eye: No discharge.     Conjunctiva/sclera: Conjunctivae normal.     Pupils: Pupils are equal, round, and reactive to light.  Neck:     Musculoskeletal: Normal range of motion and neck supple.     Thyroid: No thyromegaly.     Vascular: No carotid bruit or JVD.   Cardiovascular:     Rate and Rhythm: Normal rate and regular rhythm.     Heart sounds: Normal heart sounds.  Pulmonary:     Effort: Pulmonary effort is normal.     Breath sounds: Normal breath sounds.     Comments: Clear throughout with no adventitious sounds or use of accessory muscles. Abdominal:     General: Bowel sounds are normal.     Palpations: Abdomen is soft.  Lymphadenopathy:     Head:     Right side of head: No submental, submandibular, tonsillar, preauricular or posterior auricular adenopathy.     Left side of head: No submental, submandibular, tonsillar, preauricular or posterior auricular adenopathy.     Cervical: No cervical adenopathy.  Skin:    General: Skin is warm and dry.  Neurological:     Mental Status: She is alert and oriented to person, place, and time.  Psychiatric:        Attention and Perception: Attention normal.        Mood and Affect: Mood normal.        Speech: Speech normal.        Behavior: Behavior normal. Behavior is cooperative.        Thought Content: Thought content normal.        Judgment: Judgment normal.     Results for orders placed or performed in visit on 06/01/18  Comp Met (CMET)  Result Value Ref Range   Glucose 113 (H) 65 - 99 mg/dL   BUN 19 6 - 24 mg/dL   Creatinine, Ser 0.60 0.57 - 1.00 mg/dL   GFR calc non Af Amer 102 >59 mL/min/1.73   GFR calc Af Amer 118 >59 mL/min/1.73   BUN/Creatinine Ratio 32 (H) 9 - 23   Sodium 142 134 - 144 mmol/L   Potassium 3.4 (L) 3.5 - 5.2 mmol/L   Chloride 102 96 - 106 mmol/L   CO2 24 20 - 29 mmol/L   Calcium 9.0 8.7 - 10.2 mg/dL   Total Protein 5.3 (L) 6.0 - 8.5 g/dL   Albumin 2.8 (L) 3.5 - 5.5 g/dL   Globulin, Total 2.5 1.5 - 4.5 g/dL   Albumin/Globulin Ratio 1.1 (L) 1.2 - 2.2   Bilirubin Total 0.2 0.0 - 1.2 mg/dL   Alkaline Phosphatase 94 39 - 117 IU/L   AST 14 0 - 40 IU/L   ALT 15 0 - 32 IU/L  Lipid Profile  Result Value Ref Range   Cholesterol, Total 289 (H) 100 - 199 mg/dL    Triglycerides 876 (HH) 0 - 149 mg/dL   HDL 29 (L) >39 mg/dL   VLDL Cholesterol Cal Comment 5 - 40 mg/dL   LDL Calculated Comment 0 - 99 mg/dL   Chol/HDL Ratio 10.0 (H) 0.0 - 4.4 ratio  Assessment & Plan:   Problem List Items Addressed This Visit      Respiratory   Acute non-recurrent maxillary sinusitis    Acute.  Scripts for Augmentin, Tessalon, and Flonase sent.  Recommend rest and increase hydration at home + humidifier and sinus rinses as needed.  May take OTC Coricidin for symptom management and daily Claritin or Zyrtec for sinus drainage as needed.  Return for worsening or continues symptoms.      Relevant Medications   amoxicillin-clavulanate (AUGMENTIN) 875-125 MG tablet   fluticasone (FLONASE) 50 MCG/ACT nasal spray   benzonatate (TESSALON) 200 MG capsule   Other Relevant Orders   Veritor Flu A/B Waived   Rapid Strep Screen ([facility] ONLY)    Other Visit Diagnoses    Colon cancer screening    -  Primary   Relevant Orders   Ambulatory referral to Gastroenterology   Breast cancer screening       Relevant Orders   MM DIGITAL SCREENING BILATERAL       Follow up plan: Return if symptoms worsen or fail to improve.

## 2018-12-28 ENCOUNTER — Ambulatory Visit: Payer: Self-pay

## 2018-12-28 LAB — VERITOR FLU A/B WAIVED
Influenza A: NEGATIVE
Influenza B: NEGATIVE

## 2018-12-28 LAB — CULTURE, GROUP A STREP: Strep A Culture: NEGATIVE

## 2018-12-28 LAB — RAPID STREP SCREEN (MED CTR MEBANE ONLY): Strep Gp A Ag, IA W/Reflex: NEGATIVE

## 2018-12-28 NOTE — Telephone Encounter (Signed)
Pt called to say she was seen in office on Friday for cough and diarrhea.  She states she was give  Rx Augmentin which she started on Saturday.  She was unable to afford the cough pills that were prescribed. She is calling today to report a productive cough and her chest is sore from cough. . Bm (diarrhea) with each void. No cramping No fever. Per protocol home care advice was read to patient. Pt verbalized understanding of all instructions.  Reason for Disposition . Cough  Answer Assessment - Initial Assessment Questions 1. ONSET: "When did the cough begin?"      Last week 2. SEVERITY: "How bad is the cough today?"      severe 3. RESPIRATORY DISTRESS: "Describe your breathing."     No wheezing no SOB 4. FEVER: "Do you have a fever?" If so, ask: "What is your temperature, how was it measured, and when did it start?"     No 5. SPUTUM: "Describe the color of your sputum" (clear, white, yellow, green)     clear 6. HEMOPTYSIS: "Are you coughing up any blood?" If so ask: "How much?" (flecks, streaks, tablespoons, etc.)    No 7. CARDIAC HISTORY: "Do you have any history of heart disease?" (e.g., heart attack, congestive heart failure)      no 8. LUNG HISTORY: "Do you have any history of lung disease?"  (e.g., pulmonary embolus, asthma, emphysema)     no 9. PE RISK FACTORS: "Do you have a history of blood clots?" (or: recent major surgery, recent prolonged travel, bedridden)     no 10. OTHER SYMPTOMS: "Do you have any other symptoms?" (e.g., runny nose, wheezing, chest pain)       Runny nose chest hurts from coughing 11. PREGNANCY: "Is there any chance you are pregnant?" "When was your last menstrual period?"       N/A 12. TRAVEL: "Have you traveled out of the country in the last month?" (e.g., travel history, exposures)    No  Protocols used: [place]

## 2018-12-28 NOTE — Telephone Encounter (Signed)
If she is still ill I would not return to work.  We can give her a note for further days off at this time.  Would recommend off until symptoms no longer present.

## 2018-12-30 NOTE — Telephone Encounter (Signed)
Called pt, no answer, will try again later

## 2018-12-30 NOTE — Telephone Encounter (Signed)
Spoke with pt and let her know that we would be sending her a letter in the mail.

## 2019-01-01 ENCOUNTER — Encounter: Payer: Self-pay | Admitting: Nurse Practitioner

## 2019-01-01 NOTE — Telephone Encounter (Signed)
Spoke with pt and she is feeling better. Work note printed to mail to return to work Monday.

## 2019-01-01 NOTE — Telephone Encounter (Signed)
Thank you :)

## 2019-01-26 ENCOUNTER — Other Ambulatory Visit: Payer: Self-pay | Admitting: Family Medicine

## 2019-01-26 DIAGNOSIS — F410 Panic disorder [episodic paroxysmal anxiety] without agoraphobia: Secondary | ICD-10-CM

## 2019-01-26 NOTE — Telephone Encounter (Signed)
Requested Prescriptions  Pending Prescriptions Disp Refills  . PARoxetine (PAXIL) 30 MG tablet [Pharmacy Med Name: PARoxetine HCl 30 MG Oral Tablet] 90 tablet 1    Sig: TAKE 1 TABLET BY MOUTH ONCE DAILY FOR  STRESS     Psychiatry:  Antidepressants - SSRI Passed - 01/26/2019 11:44 AM      Passed - Valid encounter within last 6 months    Recent Outpatient Visits          1 month ago Acute non-recurrent maxillary sinusitis   [place], [place] T, NP   3 months ago Panic attacks   [place], [place], DO   7 months ago Essential hypertension   [place] Trinna Post, PA-C   1 year ago Essential hypertension   [place], [place], DO   1 year ago Needs flu shot   [place], [place], [place]

## 2019-02-01 ENCOUNTER — Encounter: Payer: Self-pay | Admitting: *Deleted

## 2019-10-29 ENCOUNTER — Other Ambulatory Visit: Payer: Self-pay | Admitting: Family Medicine

## 2019-10-29 DIAGNOSIS — F410 Panic disorder [episodic paroxysmal anxiety] without agoraphobia: Secondary | ICD-10-CM

## 2019-10-29 NOTE — Telephone Encounter (Signed)
Requested medication (s) are due for refill today: yes  Requested medication (s) are on the active medication list: yes  Last refill:  LOV-12/25/2018  Future visit scheduled:no  Notes to clinic: no valid encounter within last 6 months   Requested Prescriptions  Pending Prescriptions Disp Refills   atorvastatin (LIPITOR) 40 MG tablet [Pharmacy Med Name: Atorvastatin Calcium 40 MG Oral Tablet] 90 tablet 0    Sig: TAKE 1 TABLET BY MOUTH ONCE DAILY FOR CHOLESTEROL      Cardiovascular:  Antilipid - Statins Failed - 10/29/2019 12:16 PM      Failed - Total Cholesterol in normal range and within 360 days    Cholesterol, Total  Date Value Ref Range Status  06/01/2018 289 (H) 100 - 199 mg/dL Final          Failed - LDL in normal range and within 360 days    LDL Calculated  Date Value Ref Range Status  06/01/2018 Comment 0 - 99 mg/dL Final    Comment:    Triglyceride result indicated is too high for an accurate LDL cholesterol estimation.           Failed - HDL in normal range and within 360 days    HDL  Date Value Ref Range Status  06/01/2018 29 (L) >39 mg/dL Final          Failed - Triglycerides in normal range and within 360 days    Triglycerides  Date Value Ref Range Status  06/01/2018 876 (HH) 0 - 149 mg/dL Final          Passed - Patient is not pregnant      Passed - Valid encounter within last 12 months    Recent Outpatient Visits           10 months ago Acute non-recurrent maxillary sinusitis   [place], [place] T, NP   1 year ago Panic attacks   [place], [place], DO   1 year ago Essential hypertension   [place], [place], PA-C   1 year ago Essential hypertension   [place], [employer], DO   2 years ago Needs flu shot   [facility], [place], [place]                lisinopril-hydrochlorothiazide (ZESTORETIC) 20-25 MG tablet [Pharmacy  Med Name: Lisinopril-hydroCHLOROthiazide 20-25 MG Oral Tablet] 90 tablet 0    Sig: Take 1 tablet by mouth once daily for blood pressure      Cardiovascular:  ACEI + Diuretic Combos Failed - 10/29/2019 12:16 PM      Failed - Na in normal range and within 180 days    Sodium  Date Value Ref Range Status  06/01/2018 142 134 - 144 mmol/L Final          Failed - K in normal range and within 180 days    Potassium  Date Value Ref Range Status  06/01/2018 3.4 (L) 3.5 - 5.2 mmol/L Final          Failed - Cr in normal range and within 180 days    Creatinine, Ser  Date Value Ref Range Status  06/01/2018 0.60 0.57 - 1.00 mg/dL Final          Failed - Ca in normal range and within 180 days    Calcium  Date Value Ref Range Status  06/01/2018 9.0 8.7 - 10.2 mg/dL Final  Failed - Valid encounter within last 6 months    Recent Outpatient Visits           10 months ago Acute non-recurrent maxillary sinusitis   [place], Henrine Screws T, NP   1 year ago Panic attacks   [place], [place], DO   1 year ago Essential hypertension   [place], Adriana M, PA-C   1 year ago Essential hypertension   [place], [place], DO   2 years ago Needs flu shot   [place], [place], [place]              Passed - Patient is not pregnant      Passed - Last BP in normal range    BP Readings from Last 1 Encounters:  12/25/18 118/76            PARoxetine (PAXIL) 30 MG tablet [Pharmacy Med Name: PARoxetine HCl 30 MG Oral Tablet] 90 tablet 0    Sig: TAKE 1 TABLET BY MOUTH ONCE DAILY FOR  STRESS      Psychiatry:  Antidepressants - SSRI Failed - 10/29/2019 12:16 PM      Failed - Valid encounter within last 6 months    Recent Outpatient Visits           10 months ago Acute non-recurrent maxillary sinusitis   [place], Henrine Screws T, NP   1 year ago Panic  attacks   [place], [place], DO   1 year ago Essential hypertension   [place] Trinna Post, PA-C   1 year ago Essential hypertension   [place], [place], DO   2 years ago Needs flu shot   [place], [place], [place]

## 2019-10-29 NOTE — Telephone Encounter (Signed)
Patient has not been seen since 10/05/18- needs follow up, when booked, I'll get her enough medicine to make it to her appointment.

## 2019-10-29 NOTE — Telephone Encounter (Signed)
Called patient. LVM for patient to return call to the office.

## 2019-11-01 NOTE — Telephone Encounter (Signed)
Pt has an appt on 11-16-2019

## 2019-11-01 NOTE — Telephone Encounter (Signed)
Called patient to get her scheduled for an office visit, no answer, left a message for patient to call to schedule an appt.

## 2019-11-01 NOTE — Telephone Encounter (Signed)
Called pt to let her know rx has been sent to the pharmacy, no answer, left vm

## 2019-11-16 ENCOUNTER — Ambulatory Visit: Payer: Self-pay | Admitting: Family Medicine

## 2019-12-17 ENCOUNTER — Other Ambulatory Visit: Payer: Self-pay | Admitting: Family Medicine

## 2019-12-17 ENCOUNTER — Telehealth (INDEPENDENT_AMBULATORY_CARE_PROVIDER_SITE_OTHER): Payer: Self-pay | Admitting: Family Medicine

## 2019-12-17 ENCOUNTER — Telehealth: Payer: Self-pay | Admitting: Family Medicine

## 2019-12-17 ENCOUNTER — Encounter: Payer: Self-pay | Admitting: Family Medicine

## 2019-12-17 DIAGNOSIS — E6609 Other obesity due to excess calories: Secondary | ICD-10-CM

## 2019-12-17 DIAGNOSIS — R059 Cough, unspecified: Secondary | ICD-10-CM

## 2019-12-17 DIAGNOSIS — I10 Essential (primary) hypertension: Secondary | ICD-10-CM

## 2019-12-17 DIAGNOSIS — R05 Cough: Secondary | ICD-10-CM

## 2019-12-17 DIAGNOSIS — F410 Panic disorder [episodic paroxysmal anxiety] without agoraphobia: Secondary | ICD-10-CM

## 2019-12-17 DIAGNOSIS — E7800 Pure hypercholesterolemia, unspecified: Secondary | ICD-10-CM

## 2019-12-17 MED ORDER — HYDROXYZINE PAMOATE 25 MG PO CAPS: 25.0000 mg | ORAL_CAPSULE | Freq: Three times a day (TID) | ORAL | 0 refills | Status: DC | PRN

## 2019-12-17 MED ORDER — BUDESONIDE-FORMOTEROL FUMARATE 160-4.5 MCG/ACT IN AERO: 2.0000 | INHALATION_SPRAY | Freq: Two times a day (BID) | RESPIRATORY_TRACT | 0 refills | Status: DC

## 2019-12-17 MED ORDER — ATORVASTATIN CALCIUM 40 MG PO TABS: ORAL_TABLET | ORAL | 0 refills | Status: DC

## 2019-12-17 MED ORDER — LISINOPRIL-HYDROCHLOROTHIAZIDE 20-25 MG PO TABS: ORAL_TABLET | ORAL | 0 refills | Status: DC

## 2019-12-17 MED ORDER — ALBUTEROL SULFATE HFA 108 (90 BASE) MCG/ACT IN AERS: 2.0000 | INHALATION_SPRAY | Freq: Four times a day (QID) | RESPIRATORY_TRACT | 0 refills | Status: DC | PRN

## 2019-12-17 MED ORDER — PAROXETINE HCL 30 MG PO TABS: ORAL_TABLET | ORAL | 0 refills | Status: DC

## 2019-12-17 NOTE — Progress Notes (Signed)
There were no vitals taken for this visit.   Subjective:    Patient ID: Brittany Jacobson, female    DOB: [birth year], 59 y.o.   MRN: [id number]  HPI: Brittany Jacobson is a 59 y.o. female  Chief Complaint  Patient presents with  . URI    has been coughing about 4 months, night is worse, dry constant  . Anxiety    Needs refills  . Hyperlipidemia    Needs refills  . Hypertension    Needs refills    COUGH- has had the cough for >4 months. Was on antibiotics about 5 months ago.  Duration: months Circumstances of initial development of cough: nothing Cough severity: moderate Cough description: productive and hacking Aggravating factors:  nothing Alleviating factors: nothing Status:  stable Treatments attempted: antibiotics, benadryl Wheezing: yes Shortness of breath: yes Chest pain: no Chest tightness:yes Nasal congestion: no Runny nose: no Postnasal drip: yes Frequent throat clearing or swallowing: no Hemoptysis: no Fevers: no Night sweats: no Weight loss: no Heartburn: no Recent foreign travel: no Tuberculosis contacts: no  HYPERTENSION / HYPERLIPIDEMIA- has been out of her lisinopril-HCTZ or atorvastatin for about 2 days Satisfied with current treatment? yes Duration of hypertension: chronic BP monitoring frequency: not checking BP range: unknown BP medication side effects: yes Past BP meds: lisinopril-HCTZ Duration of hyperlipidemia: chronic Cholesterol medication side effects: no Cholesterol supplements: none Past cholesterol medications: atorvastatin Medication compliance: fair compliance Aspirin: no Recent stressors: yes Recurrent headaches: no Visual changes: no Palpitations: no Dyspnea: yes Chest pain: no Lower extremity edema: no Dizzy/lightheaded: no  ANXIETY/STRESS- states that she still has some clonazepam from 2019 Duration:exacerbated Anxious mood: yes  Excessive worrying: yes Irritability: yes  Sweating: yes Nausea:  yes Palpitations:yes Hyperventilation: yes Panic attacks: yes Agoraphobia: no  Obscessions/compulsions: no Depressed mood: yes Depression screen [facility] 2/9 12/17/2019 10/05/2018 12/19/2017 06/05/2017  Decreased Interest 0 0 1 3  Down, Depressed, Hopeless 0 1 1 3   PHQ - 2 Score 0 1 2 6   Altered sleeping 1 1 1 3   Tired, decreased energy 3 3 1 3   Change in appetite 3 0 1 3  Feeling bad or failure about yourself  0 1 1 3   Trouble concentrating 0 0 0 3  Moving slowly or fidgety/restless 0 0 0 3  Suicidal thoughts 0 0 0 0  PHQ-9 Score 7 6 6 24   Difficult doing work/chores Not difficult at all - Very difficult Very difficult   Anhedonia: no Weight changes: no Insomnia: yes hard to fall asleep  Hypersomnia: no Fatigue/loss of energy: yes Feelings of worthlessness: yes Feelings of guilt: yes Impaired concentration/indecisiveness: yes Suicidal ideations: no  Crying spells: no Recent Stressors/Life Changes: yes   Relationship problems: yes   Family stress: yes     Financial stress: yes    Job stress: no    Recent death/loss: no  Relevant past medical, surgical, family and social history reviewed and updated as indicated. Interim medical history since our last visit reviewed. Allergies and medications reviewed and updated.  Review of Systems  Constitutional: Negative.   Respiratory: Positive for cough, shortness of breath and wheezing. Negative for apnea, choking, chest tightness and stridor.   Cardiovascular: Negative.   Gastrointestinal: Negative.   Musculoskeletal: Negative.   Skin: Negative.   Psychiatric/Behavioral: Positive for dysphoric mood. Negative for agitation, behavioral problems, confusion, decreased concentration, hallucinations, self-injury, sleep disturbance and suicidal ideas. The patient is nervous/anxious. The patient is not hyperactive.     Per HPI  unless specifically indicated above     Objective:    There were no vitals taken for this visit.  Wt Readings  from Last 3 Encounters:  12/25/18 155 lb (70.3 kg)  10/05/18 160 lb 6.4 oz (72.8 kg)  06/01/18 161 lb 3.2 oz (73.1 kg)    Physical Exam Vitals and nursing note reviewed.  Pulmonary:     Effort: Pulmonary effort is normal. No respiratory distress.     Comments: Speaking in full sentences Neurological:     Mental Status: She is alert.  Psychiatric:        Mood and Affect: Mood normal.        Behavior: Behavior normal.        Thought Content: Thought content normal.        Judgment: Judgment normal.     Results for orders placed or performed in visit on 12/25/18  Rapid Strep Screen ([facility] ONLY)   Specimen: Nasal Swab   NASAL SWAB  Result Value Ref Range   Strep Gp A Ag, IA W/Reflex Negative Negative  Culture, Group A Strep   NASAL SWAB  Result Value Ref Range   Strep A Culture Negative   Veritor Flu A/B Waived  Result Value Ref Range   Influenza A Negative Negative   Influenza B Negative Negative      Assessment & Plan:   Problem List Items Addressed This Visit      Cardiovascular and Mediastinum   Hypertension    States that she is feeling well, but does not have a BP cuff. Will get her in for labs and BP check ASAP. Refills given for 1 month.       Relevant Medications   atorvastatin (LIPITOR) 40 MG tablet   lisinopril-hydrochlorothiazide (ZESTORETIC) 20-25 MG tablet     Other   Panic attacks    Not doing well. Has lots of stress. Will continue her paroxetine and change to hydroxyzine for acute issues. Will get her a consult with CCM social work to see if we can get her hooked up with a counselor. Will get her in for labs. 1 month supply given.       Relevant Medications   PARoxetine (PAXIL) 30 MG tablet   hydrOXYzine (VISTARIL) 25 MG capsule   Other Relevant Orders   Referral to Chronic Care Management Services   Hypercholesteremia    States that she is feeling well, but has not had any labs since 2019. Will get her in for labs ASAP. Refills given  for 1 month.      Relevant Medications   atorvastatin (LIPITOR) 40 MG tablet   lisinopril-hydrochlorothiazide (ZESTORETIC) 20-25 MG tablet    Other Visit Diagnoses    Cough    -  Primary   Concern for COPD. Will obtain CXR. Will start symbicort and albuterol and recheck 2 weeks. Call with any concerns.   Relevant Orders   DG Chest 2 View       Follow up plan: Return in about 2 weeks (around 12/31/2019).

## 2019-12-17 NOTE — Telephone Encounter (Signed)
Routing to provider  

## 2019-12-17 NOTE — Telephone Encounter (Signed)
Requested medication (s) are due for refill today:   Yes for both  Requested medication (s) are on the active medication list:   Yes for both  Future visit scheduled:   Yes I scheduled her for today (12/17/2019) for a virtual visit at 3:00 with Dr. Wynetta Emery   Last ordered: 11/01/2019  #30 with 0 refills for both Zestoretic and Lipitor   Requested Prescriptions  Pending Prescriptions Disp Refills   lisinopril-hydrochlorothiazide (ZESTORETIC) 20-25 MG tablet [Pharmacy Med Name: Lisinopril-hydroCHLOROthiazide 20-25 MG Oral Tablet] 30 tablet 0    Sig: Take 1 tablet by mouth once daily for blood pressure      Cardiovascular:  ACEI + Diuretic Combos Failed - 12/17/2019  9:39 AM      Failed - Na in normal range and within 180 days    Sodium  Date Value Ref Range Status  06/01/2018 142 134 - 144 mmol/L Final          Failed - K in normal range and within 180 days    Potassium  Date Value Ref Range Status  06/01/2018 3.4 (L) 3.5 - 5.2 mmol/L Final          Failed - Cr in normal range and within 180 days    Creatinine, Ser  Date Value Ref Range Status  06/01/2018 0.60 0.57 - 1.00 mg/dL Final          Failed - Ca in normal range and within 180 days    Calcium  Date Value Ref Range Status  06/01/2018 9.0 8.7 - 10.2 mg/dL Final          Failed - Valid encounter within last 6 months    Recent Outpatient Visits           11 months ago Acute non-recurrent maxillary sinusitis   [place], Henrine Screws T, NP   1 year ago Panic attacks   [place], [place], DO   1 year ago Essential hypertension   [place], [place], PA-C   1 year ago Essential hypertension   [place], [place], DO   2 years ago Needs flu shot   [place], [place], [place]              Passed - Patient is not pregnant      Passed - Last BP in normal range    BP Readings from Last 1 Encounters:   12/25/18 118/76            atorvastatin (LIPITOR) 40 MG tablet [Pharmacy Med Name: Atorvastatin Calcium 40 MG Oral Tablet] 30 tablet 0    Sig: TAKE 1 TABLET BY MOUTH ONCE DAILY FOR CHOLESTEROL      Cardiovascular:  Antilipid - Statins Failed - 12/17/2019  9:39 AM      Failed - Total Cholesterol in normal range and within 360 days    Cholesterol, Total  Date Value Ref Range Status  06/01/2018 289 (H) 100 - 199 mg/dL Final          Failed - LDL in normal range and within 360 days    LDL Calculated  Date Value Ref Range Status  06/01/2018 Comment 0 - 99 mg/dL Final    Comment:    Triglyceride result indicated is too high for an accurate LDL cholesterol estimation.           Failed - HDL in normal range and within 360 days  HDL  Date Value Ref Range Status  06/01/2018 29 (L) >39 mg/dL Final          Failed - Triglycerides in normal range and within 360 days    Triglycerides  Date Value Ref Range Status  06/01/2018 876 (HH) 0 - 149 mg/dL Final          Passed - Patient is not pregnant      Passed - Valid encounter within last 12 months    Recent Outpatient Visits           11 months ago Acute non-recurrent maxillary sinusitis   [place], Henrine Screws T, NP   1 year ago Panic attacks   [place], [place], DO   1 year ago Essential hypertension   [place] Trinna Post, PA-C   1 year ago Essential hypertension   [place], [place], DO   2 years ago Needs flu shot   [place], [place], [place]

## 2019-12-17 NOTE — Telephone Encounter (Signed)
Medication Refill - Medication: atorvastatin (LIPITOR) 40 MG tablet  lisinopril-hydrochlorothiazide (ZESTORETIC) 20-25 MG tablet   Pt is out of medication and asked if this can be sent today   Has the patient contacted their pharmacy? No. (Agent: If no, request that the patient contact the pharmacy for the refill.) (Agent: If yes, when and what did the pharmacy advise?)  Preferred Pharmacy (with phone number or street name):  [facility], [place] Phone:  [phone]  Fax:  [phone]       Agent: Please be advised that RX refills may take up to 3 business days. We ask that you follow-up with your pharmacy.

## 2019-12-24 ENCOUNTER — Telehealth: Payer: Self-pay | Admitting: Family Medicine

## 2019-12-24 ENCOUNTER — Encounter: Payer: Self-pay | Admitting: Family Medicine

## 2019-12-24 NOTE — Telephone Encounter (Signed)
-----   Message from [name], DO sent at 12/24/2019  9:49 AM EDT ----- 2 weeks if CXR looks good in person

## 2019-12-24 NOTE — Assessment & Plan Note (Addendum)
States that she is feeling well, but does not have a BP cuff. Will get her in for labs and BP check ASAP. Refills given for 1 month.

## 2019-12-24 NOTE — Assessment & Plan Note (Signed)
States that she is feeling well, but has not had any labs since 2019. Will get her in for labs ASAP. Refills given for 1 month.

## 2019-12-24 NOTE — Assessment & Plan Note (Addendum)
Not doing well. Has lots of stress. Will continue her paroxetine and change to hydroxyzine for acute issues. Will get her a consult with CCM social work to see if we can get her hooked up with a counselor. Will get her in for labs. 1 month supply given.

## 2019-12-24 NOTE — Telephone Encounter (Signed)
Called pt to schedule 2 week f/u, pt states that she will call us back to schedule

## 2020-01-05 ENCOUNTER — Ambulatory Visit: Payer: Self-pay | Admitting: Pharmacist

## 2020-01-05 ENCOUNTER — Telehealth: Payer: Self-pay | Admitting: Pharmacy Technician

## 2020-01-05 ENCOUNTER — Other Ambulatory Visit: Payer: Self-pay | Admitting: Family Medicine

## 2020-01-05 ENCOUNTER — Ambulatory Visit (INDEPENDENT_AMBULATORY_CARE_PROVIDER_SITE_OTHER): Payer: Self-pay | Admitting: Family Medicine

## 2020-01-05 ENCOUNTER — Encounter: Payer: Self-pay | Admitting: Family Medicine

## 2020-01-05 VITALS — BP 188/95

## 2020-01-05 DIAGNOSIS — I10 Essential (primary) hypertension: Secondary | ICD-10-CM

## 2020-01-05 DIAGNOSIS — F410 Panic disorder [episodic paroxysmal anxiety] without agoraphobia: Secondary | ICD-10-CM

## 2020-01-05 DIAGNOSIS — Z538 Procedure and treatment not carried out for other reasons: Secondary | ICD-10-CM

## 2020-01-05 DIAGNOSIS — E7800 Pure hypercholesterolemia, unspecified: Secondary | ICD-10-CM

## 2020-01-05 MED ORDER — LISINOPRIL-HYDROCHLOROTHIAZIDE 20-25 MG PO TABS: ORAL_TABLET | ORAL | 0 refills | Status: DC

## 2020-01-05 MED ORDER — PAROXETINE HCL 30 MG PO TABS: ORAL_TABLET | ORAL | 0 refills | Status: DC

## 2020-01-05 MED ORDER — BUDESONIDE-FORMOTEROL FUMARATE 160-4.5 MCG/ACT IN AERO: 2.0000 | INHALATION_SPRAY | Freq: Two times a day (BID) | RESPIRATORY_TRACT | 0 refills | Status: DC

## 2020-01-05 MED ORDER — ATORVASTATIN CALCIUM 40 MG PO TABS: ORAL_TABLET | ORAL | 0 refills | Status: DC

## 2020-01-05 MED ORDER — HYDROXYZINE PAMOATE 25 MG PO CAPS: 25.0000 mg | ORAL_CAPSULE | Freq: Three times a day (TID) | ORAL | 0 refills | Status: DC | PRN

## 2020-01-05 MED ORDER — ALBUTEROL SULFATE HFA 108 (90 BASE) MCG/ACT IN AERS: 2.0000 | INHALATION_SPRAY | Freq: Four times a day (QID) | RESPIRATORY_TRACT | 0 refills | Status: DC | PRN

## 2020-01-05 NOTE — Chronic Care Management (AMB) (Signed)
Chronic Care Management   Follow Up Note   01/05/2020 Name: Brittany Jacobson MRN: [id number] DOB: 1961/07/13  Referred by: [name], DO Reason for referral : Chronic Care Management (Medication Management)   Brittany Jacobson is a 59 y.o. year old female who is a primary care patient of [name], DO. The CCM team was consulted for assistance with chronic disease management and care coordination needs.    Urgent medication access need.   Review of patient status, including review of consultants reports, relevant laboratory and other test results, and collaboration with appropriate care team members and the patient's provider was performed as part of comprehensive patient evaluation and provision of chronic care management services.    SDOH (Social Determinants of Health) assessments performed: No See Care Plan activities for detailed interventions related to [facility])     Outpatient Encounter Medications as of 01/05/2020  Medication Sig Note  . [DISCONTINUED] albuterol (VENTOLIN HFA) 108 (90 Base) MCG/ACT inhaler Inhale 2 puffs into the lungs every 6 (six) hours as needed for wheezing or shortness of breath. 01/05/2020: Taking BID  . [DISCONTINUED] atorvastatin (LIPITOR) 40 MG tablet TAKE 1 TABLET BY MOUTH ONCE DAILY FOR CHOLESTEROL   . [DISCONTINUED] lisinopril-hydrochlorothiazide (ZESTORETIC) 20-25 MG tablet Take 1 tablet by mouth once daily for blood pressure   . [DISCONTINUED] PARoxetine (PAXIL) 30 MG tablet TAKE 1 TABLET BY MOUTH ONCE DAILY FOR STRESS.   . [DISCONTINUED] budesonide-formoterol (SYMBICORT) 160-4.5 MCG/ACT inhaler Inhale 2 puffs into the lungs 2 (two) times daily. (Patient not taking: Reported on 01/05/2020)   . [DISCONTINUED] hydrOXYzine (VISTARIL) 25 MG capsule Take 1 capsule (25 mg total) by mouth 3 (three) times daily as needed. (Patient not taking: Reported on 01/05/2020)    No facility-administered encounter medications on file as of 01/05/2020.      Objective:   Goals Addressed            This Visit's Progress     Patient Stated   . PharmD "I can't afford my medications" (pt-stated)       CARE PLAN ENTRY (see longtitudinal plan of care for additional care plan information)  Current Barriers:  . Financial Barriers in complicated patient with multiple medical conditions including HTN, HLD, panic attacks; patient lost her job and is uninsured, cannot afford medications at this time.  . Reports that she is thinking about moving back to [place] to be with family, as she cannot afford to stay here. However, her son is having some issues right now and she does not feel she can leave him . Lives with a friend, does not pay rent. Denies concerns w/ any other finances, transportation, or food at this time . Overdue for lab work d/t cost  o SOB: Symbicort 160/4.5 mcg BID - does NOT have, could not afford; albuterol HFA - has, taking BID o HTN: lisinopril/HCTZ 20/12.5 mg daily - has ~10 pills left o HLD: atorvastatin 40 mg daily - has ~10 pills left  o Panic: paroxetine 30 mg daily - has ~10 pills left; hydroxyzine 10 mg daily - reports she did not pick this up so that she could afford albuterol  Pharmacist Clinical Goal(s):  Marland Kitchen Over the next 30 days, patient will work with PharmD and providers to relieve medication access concerns  Interventions: . Comprehensive medication review completed; medication list updated in electronic medical record.  . Discussed Medication Management Clinic; provided address and phone number. Will message pharmacist Alm Bustard to outreach for eligibility.  Will collaborate w/ PCP to send prescriptions.   Patient Self Care Activities:  . Patient will provide necessary portions of application   Initial goal documentation         Plan:  - Will follow up in ~1-2 weeks to ensure connection w/ [place], PharmD, [place] [phone]

## 2020-01-05 NOTE — Progress Notes (Signed)
Appointment cancelled due to lack of insurance

## 2020-01-05 NOTE — Telephone Encounter (Signed)
[name] filled a 30 day supply of medication with the exception of the Symbicort.  Symbicort will have to ordered on the PAP Program once all financial documentation is provided by patient.  Will take 6 to 8 weeks to arrive once ordered.  Provided patient with new patient packet to obtain ongoing Medication Management Clinic services.  [facility] must receive requested financial documentation within 30 days in order to determine eligibility and provide additional medication assistance.  Brittany Jacobson [facility]ication Management [facility]

## 2020-01-05 NOTE — Patient Instructions (Addendum)
Visit Information  Goals Addressed            This Visit's Progress     Patient Stated   . PharmD "I can't afford my medications" (pt-stated)       CARE PLAN ENTRY (see longtitudinal plan of care for additional care plan information)  Current Barriers:  . Financial Barriers in complicated patient with multiple medical conditions including HTN, HLD, panic attacks; patient lost her job and is uninsured, cannot afford medications at this time.  . Reports that she is thinking about moving back to [place] to be with family, as she cannot afford to stay here. However, her son is having some issues right now and she does not feel she can leave him . Lives with a friend, does not pay rent. Denies concerns w/ any other finances, transportation, or food at this time . Overdue for lab work d/t cost  o SOB: Symbicort 160/4.5 mcg BID - does NOT have, could not afford; albuterol HFA - has, taking BID o HTN: lisinopril/HCTZ 20/12.5 mg daily - has ~10 pills left o HLD: atorvastatin 40 mg daily - has ~10 pills left  o Panic: paroxetine 30 mg daily - has ~10 pills left; hydroxyzine 10 mg daily - reports she did not pick this up so that she could afford albuterol  Pharmacist Clinical Goal(s):  Marland Kitchen Over the next 30 days, patient will work with PharmD and providers to relieve medication access concerns  Interventions: . Comprehensive medication review completed; medication list updated in electronic medical record.  . Discussed Medication Management Clinic; provided address and phone number. Will message pharmacist Alm Bustard to outreach for eligibility. Will collaborate w/ PCP to send prescriptions.   Patient Self Care Activities:  . Patient will provide necessary portions of application   Initial goal documentation        Patient verbalizes understanding of instructions provided today.   Plan:  - Will follow up in ~1-2 weeks to ensure connection w/ [place], PharmD,  [place] [phone]

## 2020-01-18 ENCOUNTER — Ambulatory Visit: Payer: Self-pay | Admitting: Pharmacist

## 2020-01-18 DIAGNOSIS — E7800 Pure hypercholesterolemia, unspecified: Secondary | ICD-10-CM

## 2020-01-18 DIAGNOSIS — I10 Essential (primary) hypertension: Secondary | ICD-10-CM

## 2020-01-18 NOTE — Chronic Care Management (AMB) (Signed)
  Chronic Care Management   Follow Up Note   01/18/2020 Name: Brittany Jacobson MRN: [id number] DOB: [birth year]  Referred by: [name], DO Reason for referral : Chronic Care Management (Medication Management)   Brittany Jacobson is a 59 y.o. year old female who is a primary care patient of [name], DO. The CCM team was consulted for assistance with chronic disease management and care coordination needs.    Contacted patient for medication management review.  Review of patient status, including review of consultants reports, relevant laboratory and other test results, and collaboration with appropriate care team members and the patient's provider was performed as part of comprehensive patient evaluation and provision of chronic care management services.    SDOH (Social Determinants of Health) assessments performed: No See Care Plan activities for detailed interventions related to [facility])     Outpatient Encounter Medications as of 01/18/2020  Medication Sig  . albuterol (VENTOLIN HFA) 108 (90 Base) MCG/ACT inhaler Inhale 2 puffs into the lungs every 6 (six) hours as needed for wheezing or shortness of breath.  Marland Kitchen atorvastatin (LIPITOR) 40 MG tablet TAKE 1 TABLET BY MOUTH ONCE DAILY FOR CHOLESTEROL  . budesonide-formoterol (SYMBICORT) 160-4.5 MCG/ACT inhaler Inhale 2 puffs into the lungs 2 (two) times daily.  . hydrOXYzine (VISTARIL) 25 MG capsule Take 1 capsule (25 mg total) by mouth 3 (three) times daily as needed.  Marland Kitchen lisinopril-hydrochlorothiazide (ZESTORETIC) 20-25 MG tablet Take 1 tablet by mouth once daily for blood pressure  . PARoxetine (PAXIL) 30 MG tablet TAKE 1 TABLET BY MOUTH ONCE DAILY FOR STRESS.   No facility-administered encounter medications on file as of 01/18/2020.     Objective:   Goals Addressed            This Visit's Progress     Patient Stated   . PharmD "I can't afford my medications" (pt-stated)       CARE PLAN ENTRY (see longtitudinal plan of  care for additional care plan information)  Current Barriers:  . Financial Barriers in complicated patient with multiple medical conditions including HTN, HLD, panic attacks; patient lost her job and is uninsured, cannot afford medications at this time . Connected w/ [facility] for medication support assistance.  . Overdue for lab work d/t cost  o SOB: Symbicort 160/4.5 mcg BID; [facility] will help w/ patient assistance application once patient returns financial information packet; albuterol HFA o HTN: lisinopril/HCTZ 20/12.5 mg daily o HLD: atorvastatin 40 mg daily  o Panic: paroxetine 30 mg daily; hydroxyzine 10 mg daily  Pharmacist Clinical Goal(s):  Marland Kitchen Over the next 30 days, patient will work with PharmD and providers to relieve medication access concerns  Interventions: . Patient confirms she is connected w/ [facility] and has necessary information to complete their financial eligibility packet. She will call me with any future medication questions or concerns.   Patient Self Care Activities:  . Patient will provide necessary portions of application   Please see past updates related to this goal by clicking on the "Past Updates" button in the selected goal          Plan: - Patient has my contact information for future questions or concerns  Catie Darnelle Maffucci, PharmD, [place] [phone]

## 2020-01-18 NOTE — Patient Instructions (Signed)
Visit Information  Goals Addressed            This Visit's Progress     Patient Stated   . COMPLETED: PharmD "I can't afford my medications" (pt-stated)       CARE PLAN ENTRY (see longtitudinal plan of care for additional care plan information)  Current Barriers:  . Financial Barriers in complicated patient with multiple medical conditions including HTN, HLD, panic attacks; patient lost her job and is uninsured, cannot afford medications at this time . Connected w/ [facility] for medication support assistance.  . Overdue for lab work d/t cost  o SOB: Symbicort 160/4.5 mcg BID; [facility] will help w/ patient assistance application once patient returns financial information packet; albuterol HFA o HTN: lisinopril/HCTZ 20/12.5 mg daily o HLD: atorvastatin 40 mg daily  o Panic: paroxetine 30 mg daily; hydroxyzine 10 mg daily  Pharmacist Clinical Goal(s):  Marland Kitchen Over the next 30 days, patient will work with PharmD and providers to relieve medication access concerns  Interventions: . Patient confirms she is connected w/ [facility] and has necessary information to complete their financial eligibility packet. She will call me with any future medication questions or concerns.   Patient Self Care Activities:  . Patient will provide necessary portions of application   Please see past updates related to this goal by clicking on the "Past Updates" button in the selected goal         Patient verbalizes understanding of instructions provided today.  Plan: - Patient has my contact information for future questions or concerns  Catie Darnelle Maffucci, PharmD, [place] [phone]

## 2020-01-19 ENCOUNTER — Ambulatory Visit: Payer: Self-pay

## 2020-01-19 NOTE — Chronic Care Management (AMB) (Signed)
  Care Management   Follow Up Note   01/19/2020 Name: Brittany Jacobson MRN: [id number] DOB: January 11, 1961  Referred by: [name], DO Reason for referral : Care Coordination   Brittany Jacobson is a 59 y.o. year old female who is a primary care patient of [name], DO. The care management team was consulted for assistance with care management and care coordination needs.    Review of patient status, including review of consultants reports, relevant laboratory and other test results, and collaboration with appropriate care team members and the patient's provider was performed as part of comprehensive patient evaluation and provision of chronic care management services.    LCSW completed CCM outreach attempt today but was unable to reach patient successfully. A HIPPA compliant voice message was left encouraging patient to return call once available. LCSW rescheduled CCM SW appointment as well.  A HIPPA compliant phone message was left for the patient providing contact information and requesting a return call.   Eula Fried, BSW, MSW, [facility] Care Management [email or web address] Phone: [phone]

## 2020-01-25 ENCOUNTER — Ambulatory Visit: Payer: Self-pay | Admitting: *Deleted

## 2020-01-25 NOTE — Telephone Encounter (Signed)
Patient is calling to report she is having rectal bleeding with BM for 1 week. She reports having 2-3 stools/day and bleeding reported to be "a lot" about 80% of the time. Patient advised ED per protocol- she does not have insurance and asked if her PCP can see her. Call to office to make them aware- they have no openings available to see patient. Advised UC/ED as well. Patient advised and she request that we make PCP aware- told her PCP would see this note- but advise is the same- get seen at UC/ED.  Reason for Disposition . [1] MODERATE rectal bleeding (small blood clots, passing blood without stool, or toilet water turns red) AND [2] more than once a day  Answer Assessment - Initial Assessment Questions 1. APPEARANCE of BLOOD: "What color is it?" "Is it passed separately, on the surface of the stool, or mixed in with the stool?"      Bright red and can be dark at time, mixed in yesterday 2. AMOUNT: "How much blood was passed?"      More than tablespoon 3. FREQUENCY: "How many times has blood been passed with the stools?"      80% of stools 4. ONSET: "When was the blood first seen in the stools?" (Days or weeks)      1 week 5. DIARRHEA: "Is there also some diarrhea?" If so, ask: "How many diarrhea stools were passed in past 24 hours?"      no 6. CONSTIPATION: "Do you have constipation?" If so, "How bad is it?"     Yes-patient took correctol and then she started seeing blood- stools have softened 7. RECURRENT SYMPTOMS: "Have you had blood in your stools before?" If so, ask: "When was the last time?" and "What happened that time?"      Yes- long time ago- not this heavy 8. BLOOD THINNERS: "Do you take any blood thinners?" (e.g., Coumadin/warfarin, Pradaxa/dabigatran, aspirin)     no 9. OTHER SYMPTOMS: "Do you have any other symptoms?"  (e.g., abdominal pain, vomiting, dizziness, fever)     no 10. PREGNANCY: "Is there any chance you are pregnant?" "When was your last menstrual period?"  n/a  Protocols used: RECTAL BLEEDING-A-AH

## 2020-01-25 NOTE — Telephone Encounter (Signed)
Agree with ER.

## 2020-02-08 ENCOUNTER — Telehealth: Payer: Self-pay | Admitting: Pharmacist

## 2020-02-08 NOTE — Telephone Encounter (Signed)
Patient failed to provide requested 2021 financial documentation. No additional medication assistance will be provided by [facility] without the required proof of income documentation. Patient nottified by letter. [place] [facility]

## 2020-02-23 ENCOUNTER — Ambulatory Visit (INDEPENDENT_AMBULATORY_CARE_PROVIDER_SITE_OTHER): Payer: Self-pay | Admitting: Family Medicine

## 2020-02-23 ENCOUNTER — Telehealth: Payer: Self-pay | Admitting: Family Medicine

## 2020-02-23 ENCOUNTER — Encounter: Payer: Self-pay | Admitting: Family Medicine

## 2020-02-23 VITALS — BP 162/83 | HR 78

## 2020-02-23 DIAGNOSIS — F410 Panic disorder [episodic paroxysmal anxiety] without agoraphobia: Secondary | ICD-10-CM

## 2020-02-23 DIAGNOSIS — I10 Essential (primary) hypertension: Secondary | ICD-10-CM

## 2020-02-23 DIAGNOSIS — J069 Acute upper respiratory infection, unspecified: Secondary | ICD-10-CM

## 2020-02-23 DIAGNOSIS — E7800 Pure hypercholesterolemia, unspecified: Secondary | ICD-10-CM

## 2020-02-23 MED ORDER — LISINOPRIL-HYDROCHLOROTHIAZIDE 20-25 MG PO TABS: ORAL_TABLET | ORAL | 0 refills | Status: DC

## 2020-02-23 MED ORDER — AMOXICILLIN-POT CLAVULANATE 875-125 MG PO TABS: 1.0000 | ORAL_TABLET | Freq: Two times a day (BID) | ORAL | 0 refills | Status: DC

## 2020-02-23 MED ORDER — PAROXETINE HCL 30 MG PO TABS: ORAL_TABLET | ORAL | 0 refills | Status: DC

## 2020-02-23 MED ORDER — PREDNISONE 10 MG PO TABS: ORAL_TABLET | ORAL | 0 refills | Status: DC

## 2020-02-23 MED ORDER — ATORVASTATIN CALCIUM 40 MG PO TABS: ORAL_TABLET | ORAL | 0 refills | Status: DC

## 2020-02-23 NOTE — Progress Notes (Signed)
BP (!) 162/83   Pulse 78    Subjective:    Patient ID: Brittany Jacobson, female    DOB: 30-May-1961, 59 y.o.   MRN: [id number]  HPI: Brittany Jacobson is a 59 y.o. female  Chief Complaint  Patient presents with  . Cough  . Sore Throat   UPPER RESPIRATORY TRACT INFECTION Duration: 12 days ago Worst symptom: laryngitis and cough Fever: yes Cough: yes Shortness of breath: no Wheezing: no Chest pain: no Chest tightness: no Chest congestion: no Nasal congestion: yes Runny nose: yes Post nasal drip: yes Sneezing: yes Sore throat: yes Swollen glands: no Sinus pressure: yes Headache: yes Face pain: yes Toothache: no Ear pain: yes- R ear  Ear pressure: no  Eyes red/itching:no Eye drainage/crusting: yes  Vomiting: no Rash: no Fatigue: yes Sick contacts: yes  Strep contacts: no  Context: better Recurrent sinusitis: no Relief with OTC cold/cough medications: no  Treatments attempted: emergen-c, mucinex and cough syrup   HYPERTENSION / HYPERLIPIDEMIA Satisfied with current treatment? no Duration of hypertension: chronic BP monitoring frequency: rarely BP medication side effects: no Past BP meds: lisinopril- HCTZ Duration of hyperlipidemia: chronic Cholesterol medication side effects: no Cholesterol supplements: none Past cholesterol medications: atorvastatin Medication compliance: fair compliance Aspirin: no Recent stressors: yes Recurrent headaches: yes Visual changes: no Palpitations: no Dyspnea: no Chest pain: no Lower extremity edema: no Dizzy/lightheaded: no   Relevant past medical, surgical, family and social history reviewed and updated as indicated. Interim medical history since our last visit reviewed. Allergies and medications reviewed and updated.  Review of Systems  Constitutional: Positive for chills, diaphoresis, fatigue and fever. Negative for activity change, appetite change and unexpected weight change.  HENT: Positive for congestion, ear  pain, postnasal drip, rhinorrhea, sinus pressure, sinus pain, sneezing, sore throat and voice change. Negative for dental problem, drooling, ear discharge, facial swelling, hearing loss, mouth sores, nosebleeds, tinnitus and trouble swallowing.   Eyes: Negative.   Respiratory: Positive for cough, chest tightness, shortness of breath and wheezing. Negative for apnea, choking and stridor.   Cardiovascular: Negative.   Gastrointestinal: Negative.   Musculoskeletal: Positive for myalgias. Negative for arthralgias, back pain, gait problem, joint swelling, neck pain and neck stiffness.  Skin: Negative.   Psychiatric/Behavioral: Negative.     Per HPI unless specifically indicated above     Objective:    BP (!) 162/83   Pulse 78   Wt Readings from Last 3 Encounters:  12/25/18 155 lb (70.3 kg)  10/05/18 160 lb 6.4 oz (72.8 kg)  06/01/18 161 lb 3.2 oz (73.1 kg)    Physical Exam Vitals and nursing note reviewed.  Pulmonary:     Effort: Pulmonary effort is normal. No respiratory distress.     Comments: Speaking in full sentences Neurological:     Mental Status: She is alert.  Psychiatric:        Mood and Affect: Mood normal.        Behavior: Behavior normal.        Thought Content: Thought content normal.        Judgment: Judgment normal.     Results for orders placed or performed in visit on 12/25/18  Rapid Strep Screen ([facility] ONLY)   Specimen: Nasal Swab   NASAL SWAB  Result Value Ref Range   Strep Gp A Ag, IA W/Reflex Negative Negative  Culture, Group A Strep   NASAL SWAB  Result Value Ref Range   Strep A Culture Negative  Veritor Flu A/B Waived  Result Value Ref Range   Influenza A Negative Negative   Influenza B Negative Negative      Assessment & Plan:   Problem List Items Addressed This Visit      Cardiovascular and Mediastinum   Hypertension    Not under good control. Has not had any blood work. Will give 30 days, but needs to come in for blood work.  Call with any concerns.       Relevant Medications   lisinopril-hydrochlorothiazide (ZESTORETIC) 20-25 MG tablet   atorvastatin (LIPITOR) 40 MG tablet     Other   Panic attacks   Relevant Medications   PARoxetine (PAXIL) 30 MG tablet   Hypercholesteremia    Not under good control. Has not had any blood work. Will give 30 days, but needs to come in for blood work. Call with any concerns.       Relevant Medications   lisinopril-hydrochlorothiazide (ZESTORETIC) 20-25 MG tablet   atorvastatin (LIPITOR) 40 MG tablet    Other Visit Diagnoses    Upper respiratory tract infection, unspecified type    -  Primary   Likely COVID. Will remain on quarantine for another 2 days. Will treat with prednisone and agumentin. Call with any concerns. Recheck 2 weeks.        Follow up plan: Return in about 2 weeks (around 03/08/2020) for in office.   . This visit was completed via telephone due to the restrictions of the COVID-19 pandemic. All issues as above were discussed and addressed but no physical exam was performed. If it was felt that the patient should be evaluated in the office, they were directed there. The patient verbally consented to this visit. Patient was unable to complete an audio/visual visit due to Lack of equipment. Due to the catastrophic nature of the COVID-19 pandemic, this visit was done through audio contact only. . Location of the patient: home . Location of the provider: home . Those involved with this call:  . Provider: Park Liter, DO . CMA: Lauretta Grill, RMA . Front Desk/Registration: Don Perking  . Time spent on call: 25 minutes on the phone discussing health concerns. 40 minutes total spent in review of patient's record and preparation of their chart.

## 2020-02-23 NOTE — Telephone Encounter (Signed)
-----   Message from [name], DO sent at 02/23/2020 10:37 AM EDT ----- 2 weeks in office

## 2020-02-23 NOTE — Assessment & Plan Note (Signed)
Not under good control. Has not had any blood work. Will give 30 days, but needs to come in for blood work. Call with any concerns.

## 2020-02-23 NOTE — Telephone Encounter (Signed)
lvm to make this 2 week f.u

## 2020-02-28 NOTE — Telephone Encounter (Signed)
Lvm to make this apt. ( 2 week f.u)

## 2020-02-29 NOTE — Telephone Encounter (Signed)
Unable to lvm to make this apt/  

## 2020-03-07 ENCOUNTER — Ambulatory Visit (INDEPENDENT_AMBULATORY_CARE_PROVIDER_SITE_OTHER): Payer: Self-pay | Admitting: Family Medicine

## 2020-03-07 ENCOUNTER — Other Ambulatory Visit: Payer: Self-pay

## 2020-03-07 ENCOUNTER — Encounter: Payer: Self-pay | Admitting: Family Medicine

## 2020-03-07 VITALS — BP 175/115 | HR 84 | Temp 99.2°F | Ht 62.21 in | Wt 155.6 lb

## 2020-03-07 DIAGNOSIS — R7301 Impaired fasting glucose: Secondary | ICD-10-CM

## 2020-03-07 DIAGNOSIS — E7800 Pure hypercholesterolemia, unspecified: Secondary | ICD-10-CM

## 2020-03-07 DIAGNOSIS — F410 Panic disorder [episodic paroxysmal anxiety] without agoraphobia: Secondary | ICD-10-CM

## 2020-03-07 DIAGNOSIS — I10 Essential (primary) hypertension: Secondary | ICD-10-CM

## 2020-03-07 DIAGNOSIS — Z1152 Encounter for screening for COVID-19: Secondary | ICD-10-CM

## 2020-03-07 DIAGNOSIS — R059 Cough, unspecified: Secondary | ICD-10-CM

## 2020-03-07 DIAGNOSIS — R05 Cough: Secondary | ICD-10-CM

## 2020-03-07 LAB — MICROSCOPIC EXAMINATION: Bacteria, UA: NONE SEEN

## 2020-03-07 LAB — UA/M W/RFLX CULTURE, ROUTINE
Bilirubin, UA: NEGATIVE
Ketones, UA: NEGATIVE
Leukocytes,UA: NEGATIVE
Nitrite, UA: NEGATIVE
Specific Gravity, UA: 1.025 (ref 1.005–1.030)
Urobilinogen, Ur: 0.2 mg/dL (ref 0.2–1.0)
pH, UA: 5.5 (ref 5.0–7.5)

## 2020-03-07 LAB — MICROALBUMIN, URINE WAIVED
Creatinine, Urine Waived: 100 mg/dL (ref 10–300)
Microalb, Ur Waived: 150 mg/L — ABNORMAL HIGH (ref 0–19)
Microalb/Creat Ratio: >300 mg/g — ABNORMAL HIGH (ref <30)

## 2020-03-07 LAB — BAYER DCA HB A1C WAIVED: HB A1C (BAYER DCA - WAIVED): 6.4 % (ref <7.0)

## 2020-03-07 MED ORDER — HYDROXYZINE PAMOATE 25 MG PO CAPS: 25.0000 mg | ORAL_CAPSULE | Freq: Three times a day (TID) | ORAL | 0 refills | Status: DC | PRN

## 2020-03-07 MED ORDER — PAROXETINE HCL 40 MG PO TABS: ORAL_TABLET | ORAL | 3 refills | Status: DC

## 2020-03-07 MED ORDER — LISINOPRIL-HYDROCHLOROTHIAZIDE 20-25 MG PO TABS: ORAL_TABLET | ORAL | 0 refills | Status: DC

## 2020-03-07 MED ORDER — AMLODIPINE BESYLATE 5 MG PO TABS
5.0000 mg | ORAL_TABLET | Freq: Every day | ORAL | 3 refills | Status: DC
Start: 2020-03-07 — End: 2020-06-19

## 2020-03-07 MED ORDER — ATORVASTATIN CALCIUM 40 MG PO TABS: ORAL_TABLET | ORAL | 0 refills | Status: DC

## 2020-03-07 NOTE — Assessment & Plan Note (Addendum)
Newly diagnosed today with A1c of 6.4. Work on diet and exercise and recheck 1 month. Call with any concerns.

## 2020-03-07 NOTE — Addendum Note (Signed)
Addended by: Georgina Peer on: 03/07/2020 02:58 PM   Modules accepted: Orders

## 2020-03-07 NOTE — Progress Notes (Signed)
BP (!) 175/115 (BP Location: Left Arm, Patient Position: Sitting, Cuff Size: Normal)   Pulse 84   Temp 99.2 F (37.3 C) (Oral)   Ht 5' 2.21" (1.58 m)   Wt 155 lb 9.6 oz (70.6 kg)   SpO2 98%   BMI 28.27 kg/m    Subjective:    Patient ID: Brittany Jacobson, female    DOB: Jun 27, 1961, 59 y.o.   MRN: [id number]  HPI: Brittany Jacobson is a 59 y.o. female  Chief Complaint  Patient presents with  . Hypertension  . Hyperlipidemia  . Panic Attack   HYPERTENSION / HYPERLIPIDEMIA Satisfied with current treatment? no Duration of hypertension: chronic BP monitoring frequency: weekly BP range: "High" BP medication side effects: no Past BP meds: lisinopril-HCTZ Duration of hyperlipidemia: chronic Cholesterol medication side effects: no Cholesterol supplements: none Past cholesterol medications: atorvastatin Medication compliance: excellent compliance Aspirin: no Recent stressors: no Recurrent headaches: no Visual changes: no Palpitations: no Dyspnea: no Chest pain: no Lower extremity edema: no Dizzy/lightheaded: no  COPD- has not been using her symbicort COPD status: uncontrolled Satisfied with current treatment?: no Oxygen use: no Dyspnea frequency: once every 2-3 days Cough frequency: daily Rescue inhaler frequency: 1x a day  Limitation of activity: yes Productive cough:  Pneumovax: Not Up to Date Influenza: Postpone to flu season  ANXIETY/STRESS- has a lot going on. Has not been taking her hydroxyzine Duration: chronic Status:uncontrolled Anxious mood: yes  Excessive worrying: yes Irritability: yes  Sweating: no Nausea: no Palpitations:no Hyperventilation: no Panic attacks: yes Agoraphobia: no  Obscessions/compulsions: no Depressed mood: yes Depression screen [facility] 2/9 03/07/2020 01/05/2020 12/17/2019 10/05/2018 12/19/2017  Decreased Interest 2 2 0 0 1  Down, Depressed, Hopeless 2 2 0 1 1  PHQ - 2 Score 4 4 0 1 2  Altered sleeping 2 3 1 1 1   Tired, decreased  energy 1 3 3 3 1   Change in appetite 1 3 3  0 1  Feeling bad or failure about yourself  1 2 0 1 1  Trouble concentrating 0 2 0 0 0  Moving slowly or fidgety/restless 0 2 0 0 0  Suicidal thoughts 0 2 0 0 0  PHQ-9 Score 9 21 7 6 6   Difficult doing work/chores Very difficult Not difficult at all Not difficult at all - Very difficult   GAD 7 : Generalized Anxiety Score 03/07/2020 01/05/2020 12/17/2019 10/05/2018  Nervous, Anxious, on Edge 2 3 0 1  Control/stop worrying 1 2 0 0  Worry too much - different things 1 2 0 1  Trouble relaxing 1 2 0 1  Restless 0 2 0 0  Easily annoyed or irritable 1 2 0 1  Afraid - awful might happen 0 2 0 0  Total GAD 7 Score 6 15 0 4  Anxiety Difficulty Somewhat difficult Not difficult at all Not difficult at all Not difficult at all   Anhedonia: no Weight changes: no Insomnia: no   Hypersomnia: no Fatigue/loss of energy: yes Feelings of worthlessness: no Feelings of guilt: no Impaired concentration/indecisiveness: no Suicidal ideations: no  Crying spells: no Recent Stressors/Life Changes: no   Relationship problems: no   Family stress: no     Financial stress: no    Job stress: no    Recent death/loss: no   Relevant past medical, surgical, family and social history reviewed and updated as indicated. Interim medical history since our last visit reviewed. Allergies and medications reviewed and updated.  Review of Systems  Constitutional: Negative.  HENT: Negative.   Respiratory: Negative.   Cardiovascular: Negative.   Gastrointestinal: Negative.   Musculoskeletal: Negative.   Skin: Negative.   Psychiatric/Behavioral: Positive for dysphoric mood. Negative for agitation, behavioral problems, confusion, decreased concentration, hallucinations, self-injury, sleep disturbance and suicidal ideas. The patient is nervous/anxious. The patient is not hyperactive.     Per HPI unless specifically indicated above     Objective:    BP (!) 175/115 (BP  Location: Left Arm, Patient Position: Sitting, Cuff Size: Normal)   Pulse 84   Temp 99.2 F (37.3 C) (Oral)   Ht 5' 2.21" (1.58 m)   Wt 155 lb 9.6 oz (70.6 kg)   SpO2 98%   BMI 28.27 kg/m   Wt Readings from Last 3 Encounters:  03/07/20 155 lb 9.6 oz (70.6 kg)  12/25/18 155 lb (70.3 kg)  10/05/18 160 lb 6.4 oz (72.8 kg)    Physical Exam Vitals and nursing note reviewed.  Constitutional:      General: She is not in acute distress.    Appearance: Normal appearance. She is not ill-appearing, toxic-appearing or diaphoretic.  HENT:     Head: Normocephalic and atraumatic.     Right Ear: External ear normal.     Left Ear: External ear normal.     Nose: Nose normal.     Mouth/Throat:     Mouth: Mucous membranes are moist.     Pharynx: Oropharynx is clear.  Eyes:     General: No scleral icterus.       Right eye: No discharge.        Left eye: No discharge.     Extraocular Movements: Extraocular movements intact.     Conjunctiva/sclera: Conjunctivae normal.     Pupils: Pupils are equal, round, and reactive to light.  Cardiovascular:     Rate and Rhythm: Normal rate and regular rhythm.     Pulses: Normal pulses.     Heart sounds: Normal heart sounds. No murmur. No friction rub. No gallop.   Pulmonary:     Effort: Pulmonary effort is normal. No respiratory distress.     Breath sounds: Normal breath sounds. No stridor. No wheezing, rhonchi or rales.  Chest:     Chest wall: No tenderness.  Musculoskeletal:        General: Normal range of motion.     Cervical back: Normal range of motion and neck supple.  Skin:    General: Skin is warm and dry.     Capillary Refill: Capillary refill takes less than 2 seconds.     Coloration: Skin is not jaundiced or pale.     Findings: No bruising, erythema, lesion or rash.  Neurological:     General: No focal deficit present.     Mental Status: She is alert and oriented to person, place, and time. Mental status is at baseline.  Psychiatric:          Mood and Affect: Mood normal.        Behavior: Behavior normal.        Thought Content: Thought content normal.        Judgment: Judgment normal.     Results for orders placed or performed in visit on 12/25/18  Rapid Strep Screen ([facility] ONLY)   Specimen: Nasal Swab   NASAL SWAB  Result Value Ref Range   Strep Gp A Ag, IA W/Reflex Negative Negative  Culture, Group A Strep   NASAL SWAB  Result Value Ref Range   Strep  A Culture Negative   Veritor Flu A/B Waived  Result Value Ref Range   Influenza A Negative Negative   Influenza B Negative Negative      Assessment & Plan:   Problem List Items Addressed This Visit      Cardiovascular and Mediastinum   Hypertension    Not under good control. Continue lisinopril- HCTZ and add amlodipine. Call with any concerns. Recheck 2 weeks.       Relevant Medications   amLODipine (NORVASC) 5 MG tablet   lisinopril-hydrochlorothiazide (ZESTORETIC) 20-25 MG tablet   atorvastatin (LIPITOR) 40 MG tablet     Endocrine   IFG (impaired fasting glucose)    Newly diagnosed today with A1c of 6.4. Work on diet and exercise and recheck 1 month. Call with any concerns.         Other   Panic attacks    Not doing great. Will increase her paxil to 40mg  and recheck in 2 weeks. Call with any concerns. Continue hydroxyzine.      Relevant Medications   PARoxetine (PAXIL) 40 MG tablet   hydrOXYzine (VISTARIL) 25 MG capsule   Hypercholesteremia    Under good control on current regimen. Continue current regimen. Continue to monitor. Call with any concerns. Refills given. Labs drawn today.       Relevant Medications   amLODipine (NORVASC) 5 MG tablet   lisinopril-hydrochlorothiazide (ZESTORETIC) 20-25 MG tablet   atorvastatin (LIPITOR) 40 MG tablet    Other Visit Diagnoses    Cough    -  Primary   Did not get her CXR or start her symbicort. Lungs clear. Start symbicort and recheck 2 weeks. Call with any concerns.        Follow up  plan: Return 1-2 weeks IN PERSON, for Blood pressure follow up.

## 2020-03-07 NOTE — Assessment & Plan Note (Signed)
Not under good control. Continue lisinopril- HCTZ and add amlodipine. Call with any concerns. Recheck 2 weeks.

## 2020-03-07 NOTE — Assessment & Plan Note (Signed)
Not doing great. Will increase her paxil to 40mg  and recheck in 2 weeks. Call with any concerns. Continue hydroxyzine.

## 2020-03-07 NOTE — Assessment & Plan Note (Signed)
Under good control on current regimen. Continue current regimen. Continue to monitor. Call with any concerns. Refills given. Labs drawn today.   

## 2020-03-08 LAB — COMPREHENSIVE METABOLIC PANEL
ALT: 19 IU/L (ref 0–32)
AST: 18 IU/L (ref 0–40)
Albumin/Globulin Ratio: 1.5 (ref 1.2–2.2)
Albumin: 3.5 g/dL — ABNORMAL LOW (ref 3.8–4.9)
Alkaline Phosphatase: 101 IU/L (ref 48–121)
BUN/Creatinine Ratio: 30 — ABNORMAL HIGH (ref 9–23)
BUN: 43 mg/dL — ABNORMAL HIGH (ref 6–24)
Bilirubin Total: 0.4 mg/dL (ref 0.0–1.2)
CO2: 20 mmol/L (ref 20–29)
Calcium: 8.7 mg/dL (ref 8.7–10.2)
Chloride: 106 mmol/L (ref 96–106)
Creatinine, Ser: 1.43 mg/dL — ABNORMAL HIGH (ref 0.57–1.00)
GFR calc Af Amer: 47 mL/min/{1.73_m2} — ABNORMAL LOW (ref >59)
GFR calc non Af Amer: 40 mL/min/{1.73_m2} — ABNORMAL LOW (ref >59)
Globulin, Total: 2.3 g/dL (ref 1.5–4.5)
Glucose: 93 mg/dL (ref 65–99)
Potassium: 4 mmol/L (ref 3.5–5.2)
Sodium: 139 mmol/L (ref 134–144)
Total Protein: 5.8 g/dL — ABNORMAL LOW (ref 6.0–8.5)

## 2020-03-08 LAB — CBC WITH DIFFERENTIAL/PLATELET
Basophils Absolute: 0.1 10*3/uL (ref 0.0–0.2)
Basos: 1 %
EOS (ABSOLUTE): 0.1 10*3/uL (ref 0.0–0.4)
Eos: 1 %
Hematocrit: 41.7 % (ref 34.0–46.6)
Hemoglobin: 13.7 g/dL (ref 11.1–15.9)
Immature Grans (Abs): 0 10*3/uL (ref 0.0–0.1)
Immature Granulocytes: 0 %
Lymphocytes Absolute: 2.9 10*3/uL (ref 0.7–3.1)
Lymphs: 22 %
MCH: 29 pg (ref 26.6–33.0)
MCHC: 32.9 g/dL (ref 31.5–35.7)
MCV: 88 fL (ref 79–97)
Monocytes Absolute: 0.9 10*3/uL (ref 0.1–0.9)
Monocytes: 7 %
Neutrophils Absolute: 9 10*3/uL — ABNORMAL HIGH (ref 1.4–7.0)
Neutrophils: 69 %
Platelets: 243 10*3/uL (ref 150–450)
RBC: 4.72 x10E6/uL (ref 3.77–5.28)
RDW: 13.1 % (ref 11.7–15.4)
WBC: 13.1 10*3/uL — ABNORMAL HIGH (ref 3.4–10.8)

## 2020-03-08 LAB — SAR COV2 SEROLOGY (COVID19)AB(IGG),IA: DiaSorin SARS-CoV-2 Ab, IgG: NEGATIVE

## 2020-03-08 LAB — LIPID PANEL W/O CHOL/HDL RATIO
Cholesterol, Total: 261 mg/dL — ABNORMAL HIGH (ref 100–199)
HDL: 46 mg/dL (ref >39)
LDL Chol Calc (NIH): 139 mg/dL — ABNORMAL HIGH (ref 0–99)
Triglycerides: 416 mg/dL — ABNORMAL HIGH (ref 0–149)
VLDL Cholesterol Cal: 76 mg/dL — ABNORMAL HIGH (ref 5–40)

## 2020-03-08 LAB — TSH: TSH: 2.07 u[IU]/mL (ref 0.450–4.500)

## 2020-03-09 ENCOUNTER — Telehealth: Payer: Self-pay | Admitting: Family Medicine

## 2020-03-09 NOTE — Telephone Encounter (Signed)
Routing to provider  

## 2020-03-09 NOTE — Telephone Encounter (Signed)
Copied from [place] [phone]. Topic: General - Other >> Mar 09, 2020  2:54 PM Keene Breath wrote: Reason for CRM: Patient called to ask if the doctor had her test results for covid antibodies.  She stated that the doctor told her she would let her know but has not heard anything yet.  Please advise and call patient with an update at [phone]

## 2020-03-09 NOTE — Telephone Encounter (Signed)
Please see result note 

## 2020-03-15 ENCOUNTER — Ambulatory Visit: Payer: Self-pay

## 2020-03-15 NOTE — Chronic Care Management (AMB) (Signed)
  Care Management   Follow Up Note   03/15/2020 Name: Brittany Jacobson MRN: [id number] DOB: Sep 19, 1961  Referred by: [name], DO Reason for referral : Care Coordination   Brittany Jacobson is a 59 y.o. year old female who is a primary care patient of [name], DO. The care management team was consulted for assistance with care management and care coordination needs.    Review of patient status, including review of consultants reports, relevant laboratory and other test results, and collaboration with appropriate care team members and the patient's provider was performed as part of comprehensive patient evaluation and provision of chronic care management services.    LCSW completed CCM outreach attempt today but was unable to reach patient successfully. A HIPPA compliant voice message was left encouraging patient to return call once available. LCSW rescheduled CCM SW appointment as well.  A HIPPA compliant phone message was left for the patient providing contact information and requesting a return call.   Eula Fried, BSW, MSW, [facility] Care Management [email or web address] Phone: [phone]

## 2020-03-31 ENCOUNTER — Ambulatory Visit: Payer: Self-pay | Admitting: Family Medicine

## 2020-04-26 ENCOUNTER — Other Ambulatory Visit: Payer: Self-pay | Admitting: Family Medicine

## 2020-05-31 ENCOUNTER — Telehealth: Payer: Self-pay | Admitting: Licensed Clinical Social Worker

## 2020-05-31 ENCOUNTER — Telehealth: Payer: Self-pay

## 2020-05-31 NOTE — Telephone Encounter (Signed)
  Chronic Care Management    Clinical Social Work General Follow Up Note  05/31/2020 Name: MURLINE WEIGEL MRN: [id number] DOB: [birth year]  ARMANDINA IMAN is a 59 y.o. year old female who is a primary care patient of [name], DO. The CCM team was consulted for assistance with Mental Health Counseling and Resources.   Review of patient status, including review of consultants reports, relevant laboratory and other test results, and collaboration with appropriate care team members and the patient's provider was performed as part of comprehensive patient evaluation and provision of chronic care management services.    LCSW completed third and final CCM outreach attempt today but was unable to reach patient successfully. A HIPPA compliant voice message was left encouraging patient to return call once available if she is still interested in [place]. LCSW will complete case closure at this time due to three unsuccessful outreach attempts.    Outpatient Encounter Medications as of 05/31/2020  Medication Sig  . albuterol (VENTOLIN HFA) 108 (90 Base) MCG/ACT inhaler Inhale 2 puffs into the lungs every 6 (six) hours as needed for wheezing or shortness of breath.  Marland Kitchen amLODipine (NORVASC) 5 MG tablet Take 1 tablet (5 mg total) by mouth daily.  Marland Kitchen atorvastatin (LIPITOR) 40 MG tablet TAKE 1 TABLET BY MOUTH ONCE DAILY FOR CHOLESTEROL  . budesonide-formoterol (SYMBICORT) 160-4.5 MCG/ACT inhaler Inhale 2 puffs into the lungs 2 (two) times daily. (Patient not taking: Reported on 02/23/2020)  . hydrOXYzine (VISTARIL) 25 MG capsule Take 1 capsule (25 mg total) by mouth 3 (three) times daily as needed.  Marland Kitchen lisinopril-hydrochlorothiazide (ZESTORETIC) 20-25 MG tablet Take 1 tablet by mouth once daily for blood pressure  . PARoxetine (PAXIL) 40 MG tablet TAKE 1 TABLET BY MOUTH ONCE DAILY FOR STRESS.   No facility-administered encounter medications on file as of 05/31/2020.    Follow Up Plan: LCSW  will contact CCM LCSW directly or ask PCP for an additional referral to CCM program if still interested in services and support   Eula Fried, BSW, MSW, [email or web address] Phone: [phone]

## 2020-06-19 ENCOUNTER — Other Ambulatory Visit: Payer: Self-pay | Admitting: Family Medicine

## 2020-06-19 MED ORDER — ATORVASTATIN CALCIUM 40 MG PO TABS: ORAL_TABLET | ORAL | 0 refills | Status: DC

## 2020-06-19 MED ORDER — AMLODIPINE BESYLATE 5 MG PO TABS: 5.0000 mg | ORAL_TABLET | Freq: Every day | ORAL | 3 refills | Status: DC

## 2020-06-19 NOTE — Telephone Encounter (Signed)
amLODipine (NORVASC) 5 MG tablet  lisinopril-hydrochlorothiazide (ZESTORETIC) 20-25 MG tablet  atorvastatin (LIPITOR) 40 MG tablet    Pharmacy: [facility] [address], [place], [place] [address]

## 2020-07-05 ENCOUNTER — Other Ambulatory Visit: Payer: Self-pay

## 2020-07-05 MED ORDER — LISINOPRIL-HYDROCHLOROTHIAZIDE 20-25 MG PO TABS: 1.0000 | ORAL_TABLET | Freq: Every day | ORAL | 0 refills | Status: DC

## 2020-07-05 NOTE — Telephone Encounter (Signed)
Fax from [facility]

## 2020-07-05 NOTE — Addendum Note (Signed)
Addended by: [name] on: 07/05/2020 09:39 PM   Modules accepted: Orders

## 2020-07-05 NOTE — Telephone Encounter (Signed)
Routing to admin

## 2020-07-05 NOTE — Telephone Encounter (Signed)
Pt did not want to see any other provider, Pt states she is confused on her medication and would like clinical staff to explain to her more on the BP medication.Pt scheduled for 07/27/2020 and would like medication to be bridged over.

## 2020-07-05 NOTE — Telephone Encounter (Signed)
Called and LVM asking for patient to please return my call. Routing to provider as appointment has been scheduled.

## 2020-07-27 ENCOUNTER — Other Ambulatory Visit: Payer: Self-pay

## 2020-07-27 ENCOUNTER — Encounter: Payer: Self-pay | Admitting: Family Medicine

## 2020-07-27 ENCOUNTER — Ambulatory Visit (INDEPENDENT_AMBULATORY_CARE_PROVIDER_SITE_OTHER): Payer: Self-pay | Admitting: Family Medicine

## 2020-07-27 VITALS — BP 148/88 | HR 80 | Temp 98.3°F | Resp 16 | Wt 161.0 lb

## 2020-07-27 DIAGNOSIS — E7800 Pure hypercholesterolemia, unspecified: Secondary | ICD-10-CM

## 2020-07-27 DIAGNOSIS — F410 Panic disorder [episodic paroxysmal anxiety] without agoraphobia: Secondary | ICD-10-CM

## 2020-07-27 DIAGNOSIS — R7301 Impaired fasting glucose: Secondary | ICD-10-CM

## 2020-07-27 DIAGNOSIS — I10 Essential (primary) hypertension: Secondary | ICD-10-CM

## 2020-07-27 LAB — MICROALBUMIN, URINE WAIVED
Creatinine, Urine Waived: 100 mg/dL (ref 10–300)
Microalb, Ur Waived: 150 mg/L — ABNORMAL HIGH (ref 0–19)
Microalb/Creat Ratio: >300 mg/g — ABNORMAL HIGH (ref <30)

## 2020-07-27 LAB — URINALYSIS, ROUTINE W REFLEX MICROSCOPIC
Bilirubin, UA: NEGATIVE
Ketones, UA: NEGATIVE
Leukocytes,UA: NEGATIVE
Nitrite, UA: NEGATIVE
Specific Gravity, UA: 1.025 (ref 1.005–1.030)
Urobilinogen, Ur: 0.2 mg/dL (ref 0.2–1.0)
pH, UA: 5.5 (ref 5.0–7.5)

## 2020-07-27 LAB — MICROSCOPIC EXAMINATION

## 2020-07-27 LAB — BAYER DCA HB A1C WAIVED: HB A1C (BAYER DCA - WAIVED): 5.8 % (ref <7.0)

## 2020-07-27 MED ORDER — BUDESONIDE-FORMOTEROL FUMARATE 160-4.5 MCG/ACT IN AERO: 2.0000 | INHALATION_SPRAY | Freq: Two times a day (BID) | RESPIRATORY_TRACT | 3 refills | Status: AC

## 2020-07-27 MED ORDER — PAROXETINE HCL 40 MG PO TABS: ORAL_TABLET | ORAL | 1 refills | Status: DC

## 2020-07-27 MED ORDER — AMLODIPINE BESYLATE 5 MG PO TABS: 5.0000 mg | ORAL_TABLET | Freq: Every day | ORAL | 1 refills | Status: DC

## 2020-07-27 MED ORDER — HYDROXYZINE PAMOATE 25 MG PO CAPS: 25.0000 mg | ORAL_CAPSULE | Freq: Three times a day (TID) | ORAL | 1 refills | Status: AC | PRN

## 2020-07-27 MED ORDER — ATORVASTATIN CALCIUM 40 MG PO TABS
ORAL_TABLET | ORAL | 1 refills | Status: DC
Start: 2020-07-27 — End: 2020-07-28

## 2020-07-27 MED ORDER — ALBUTEROL SULFATE HFA 108 (90 BASE) MCG/ACT IN AERS: 2.0000 | INHALATION_SPRAY | Freq: Four times a day (QID) | RESPIRATORY_TRACT | 1 refills | Status: AC | PRN

## 2020-07-27 NOTE — Assessment & Plan Note (Signed)
Under good control on current regimen. Continue current regimen. Continue to monitor. Call with any concerns. Refills given. Labs drawn today.   

## 2020-07-27 NOTE — Progress Notes (Signed)
BP (!) 148/88 (BP Location: Left Arm, Patient Position: Sitting, Cuff Size: Normal)   Pulse 80   Temp 98.3 F (36.8 C) (Oral)   Resp 16   Wt 161 lb (73 kg)   SpO2 99%   BMI 29.25 kg/m    Subjective:    Patient ID: Brittany Jacobson, female    DOB: 07-01-1961, 59 y.o.   MRN: [id number]  HPI: Brittany Jacobson is a 59 y.o. female  Chief Complaint  Patient presents with  . Hypertension  . Hyperlipidemia  . IFG  . Anxiety   Impaired Fasting Glucose HbA1C:  Lab Results  Component Value Date   HGBA1C 6.4 03/07/2020   Duration of elevated blood sugar: chronic Polydipsia: no Polyuria: no Weight change: no Visual disturbance: no Glucose Monitoring: no    Accucheck frequency: Not Checking Diabetic Education: Not Completed Family history of diabetes: no  HYPERTENSION / HYPERLIPIDEMIA- has been out of her medicine for 2 days Satisfied with current treatment? yes Duration of hypertension: chronic BP monitoring frequency: not checking BP medication side effects: no Past BP meds: amlodipine, lisinopril, HCTZ Duration of hyperlipidemia: chronic Cholesterol medication side effects: no Cholesterol supplements: none Past cholesterol medications: atorvastatin Medication compliance: good compliance Aspirin: no Recent stressors: no Recurrent headaches: no Visual changes: no Palpitations: no Dyspnea: no Chest pain: no Lower extremity edema: no Dizzy/lightheaded: no  ANXIETY/STRESS- paxil is really really helping. She is not having panic attacks. She is doing well.  Duration: chronic Status:controlled Anxious mood: yes  Excessive worrying: yes Irritability: no  Sweating: no Nausea: no Palpitations:no Hyperventilation: no Panic attacks: yes Agoraphobia: no  Obscessions/compulsions: no Depressed mood: no Depression screen [facility] 2/9 03/07/2020 01/05/2020 12/17/2019 10/05/2018 12/19/2017  Decreased Interest 2 2 0 0 1  Down, Depressed, Hopeless 2 2 0 1 1  PHQ - 2 Score 4 4 0 1  2  Altered sleeping 2 3 1 1 1   Tired, decreased energy 1 3 3 3 1   Change in appetite 1 3 3  0 1  Feeling bad or failure about yourself  1 2 0 1 1  Trouble concentrating 0 2 0 0 0  Moving slowly or fidgety/restless 0 2 0 0 0  Suicidal thoughts 0 2 0 0 0  PHQ-9 Score 9 21 7 6 6   Difficult doing work/chores Very difficult Not difficult at all Not difficult at all - Very difficult   Anhedonia: no Weight changes: no Insomnia: no   Hypersomnia: no Fatigue/loss of energy: no Feelings of worthlessness: no Feelings of guilt: no Impaired concentration/indecisiveness: no Suicidal ideations: no  Crying spells: no Recent Stressors/Life Changes: no   Relationship problems: no   Family stress: no     Financial stress: no    Job stress: no    Recent death/loss: no   Relevant past medical, surgical, family and social history reviewed and updated as indicated. Interim medical history since our last visit reviewed. Allergies and medications reviewed and updated.  Review of Systems  Constitutional: Negative.   Respiratory: Negative.   Cardiovascular: Negative.   Gastrointestinal: Negative.   Musculoskeletal: Negative.   Neurological: Negative.   Psychiatric/Behavioral: Negative.     Per HPI unless specifically indicated above     Objective:    BP (!) 148/88 (BP Location: Left Arm, Patient Position: Sitting, Cuff Size: Normal)   Pulse 80   Temp 98.3 F (36.8 C) (Oral)   Resp 16   Wt 161 lb (73 kg)   SpO2 99%  BMI 29.25 kg/m   Wt Readings from Last 3 Encounters:  07/27/20 161 lb (73 kg)  03/07/20 155 lb 9.6 oz (70.6 kg)  12/25/18 155 lb (70.3 kg)    Physical Exam Vitals and nursing note reviewed.  Constitutional:      General: She is not in acute distress.    Appearance: Normal appearance. She is not ill-appearing, toxic-appearing or diaphoretic.  HENT:     Head: Normocephalic and atraumatic.     Right Ear: External ear normal.     Left Ear: External ear normal.      Nose: Nose normal.     Mouth/Throat:     Mouth: Mucous membranes are moist.     Pharynx: Oropharynx is clear.  Eyes:     General: No scleral icterus.       Right eye: No discharge.        Left eye: No discharge.     Extraocular Movements: Extraocular movements intact.     Conjunctiva/sclera: Conjunctivae normal.     Pupils: Pupils are equal, round, and reactive to light.  Cardiovascular:     Rate and Rhythm: Normal rate and regular rhythm.     Pulses: Normal pulses.     Heart sounds: Normal heart sounds. No murmur heard.  No friction rub. No gallop.   Pulmonary:     Effort: Pulmonary effort is normal. No respiratory distress.     Breath sounds: Normal breath sounds. No stridor. No wheezing, rhonchi or rales.  Chest:     Chest wall: No tenderness.  Musculoskeletal:        General: Normal range of motion.     Cervical back: Normal range of motion and neck supple.  Skin:    General: Skin is warm and dry.     Capillary Refill: Capillary refill takes less than 2 seconds.     Coloration: Skin is not jaundiced or pale.     Findings: No bruising, erythema, lesion or rash.  Neurological:     General: No focal deficit present.     Mental Status: She is alert and oriented to person, place, and time. Mental status is at baseline.  Psychiatric:        Mood and Affect: Mood normal.        Behavior: Behavior normal.        Thought Content: Thought content normal.        Judgment: Judgment normal.     Results for orders placed or performed in visit on 03/07/20  Microscopic Examination   URINE  Result Value Ref Range   WBC, UA 0-5 0 - 5 /hpf   RBC 3-10 (A) 0 - 2 /hpf   Epithelial Cells (non renal) 0-10 0 - 10 /hpf   Bacteria, UA None seen None seen/Few  UA/M w/rflx Culture, Routine   Specimen: Urine   URINE  Result Value Ref Range   Specific Gravity, UA 1.025 1.005 - 1.030   pH, UA 5.5 5.0 - 7.5   Color, UA Yellow Yellow   Appearance Ur Clear Clear   Leukocytes,UA Negative  Negative   Protein,UA 3+ (A) Negative/Trace   Glucose, UA 1+ (A) Negative   Ketones, UA Negative Negative   RBC, UA 2+ (A) Negative   Bilirubin, UA Negative Negative   Urobilinogen, Ur 0.2 0.2 - 1.0 mg/dL   Nitrite, UA Negative Negative   Microscopic Examination See below:   TSH  Result Value Ref Range   TSH 2.070 0.450 - 4.500 uIU/mL  Microalbumin, Urine Waived  Result Value Ref Range   Microalb, Ur Waived 150 (H) 0 - 19 mg/L   Creatinine, Urine Waived 100 10 - 300 mg/dL   Microalb/Creat Ratio >300 (H) <30 mg/g  Lipid Panel w/o Chol/HDL Ratio  Result Value Ref Range   Cholesterol, Total 261 (H) 100 - 199 mg/dL   Triglycerides 416 (H) 0 - 149 mg/dL   HDL 46 >39 mg/dL   VLDL Cholesterol Cal 76 (H) 5 - 40 mg/dL   LDL Chol Calc (NIH) 139 (H) 0 - 99 mg/dL  Comprehensive metabolic panel  Result Value Ref Range   Glucose 93 65 - 99 mg/dL   BUN 43 (H) 6 - 24 mg/dL   Creatinine, Ser 1.43 (H) 0.57 - 1.00 mg/dL   GFR calc non Af Amer 40 (L) >59 mL/min/1.73   GFR calc Af Amer 47 (L) >59 mL/min/1.73   BUN/Creatinine Ratio 30 (H) 9 - 23   Sodium 139 134 - 144 mmol/L   Potassium 4.0 3.5 - 5.2 mmol/L   Chloride 106 96 - 106 mmol/L   CO2 20 20 - 29 mmol/L   Calcium 8.7 8.7 - 10.2 mg/dL   Total Protein 5.8 (L) 6.0 - 8.5 g/dL   Albumin 3.5 (L) 3.8 - 4.9 g/dL   Globulin, Total 2.3 1.5 - 4.5 g/dL   Albumin/Globulin Ratio 1.5 1.2 - 2.2   Bilirubin Total 0.4 0.0 - 1.2 mg/dL   Alkaline Phosphatase 101 48 - 121 IU/L   AST 18 0 - 40 IU/L   ALT 19 0 - 32 IU/L  CBC with Differential/Platelet  Result Value Ref Range   WBC 13.1 (H) 3.4 - 10.8 x10E3/uL   RBC 4.72 3.77 - 5.28 x10E6/uL   Hemoglobin 13.7 11.1 - 15.9 g/dL   Hematocrit 41.7 34.0 - 46.6 %   MCV 88 79 - 97 fL   MCH 29.0 26.6 - 33.0 pg   MCHC 32.9 31 - 35 g/dL   RDW 13.1 11.7 - 15.4 %   Platelets 243 150 - 450 x10E3/uL   Neutrophils 69 Not Estab. %   Lymphs 22 Not Estab. %   Monocytes 7 Not Estab. %   Eos 1 Not Estab. %   Basos  1 Not Estab. %   Neutrophils Absolute 9.0 (H) 1.40 - 7.00 x10E3/uL   Lymphocytes Absolute 2.9 0 - 3 x10E3/uL   Monocytes Absolute 0.9 0 - 0 x10E3/uL   EOS (ABSOLUTE) 0.1 0.0 - 0.4 x10E3/uL   Basophils Absolute 0.1 0 - 0 x10E3/uL   Immature Granulocytes 0 Not Estab. %   Immature Grans (Abs) 0.0 0.0 - 0.1 x10E3/uL  Bayer DCA Hb A1c Waived  Result Value Ref Range   HB A1C (BAYER DCA - WAIVED) 6.4 <7.0 %  SAR CoV2 Serology (COVID 19)AB(IGG)IA  Result Value Ref Range   DiaSorin SARS-CoV-2 Ab, IgG Negative Negative      Assessment & Plan:   Problem List Items Addressed This Visit      Cardiovascular and Mediastinum   Hypertension - Primary    Restart medicine. Checking labs today. Call with any concerns. Continue to monitor.       Relevant Medications   atorvastatin (LIPITOR) 40 MG tablet   amLODipine (NORVASC) 5 MG tablet   Other Relevant Orders   CBC with Differential/Platelet   Comprehensive metabolic panel   Microalbumin, Urine Waived   TSH     Endocrine   IFG (impaired fasting glucose)    Rechecking labs  today. Await results. Treat as needed.       Relevant Orders   Bayer DCA Hb A1c Waived   CBC with Differential/Platelet   Comprehensive metabolic panel   Microalbumin, Urine Waived   Urinalysis, Routine w reflex microscopic     Other   Panic attacks    Doing MUCH better. Continue paxil. Continue to monitor Call with any concerns. Refills given.       Relevant Medications   PARoxetine (PAXIL) 40 MG tablet   hydrOXYzine (VISTARIL) 25 MG capsule   Other Relevant Orders   CBC with Differential/Platelet   Comprehensive metabolic panel   TSH   Hypercholesteremia    Under good control on current regimen. Continue current regimen. Continue to monitor. Call with any concerns. Refills given. Labs drawn today.       Relevant Medications   atorvastatin (LIPITOR) 40 MG tablet   amLODipine (NORVASC) 5 MG tablet   Other Relevant Orders   CBC with  Differential/Platelet   Comprehensive metabolic panel   Lipid Panel w/o Chol/HDL Ratio       Follow up plan: Return in about 6 months (around 01/25/2021) for physical.

## 2020-07-27 NOTE — Assessment & Plan Note (Signed)
Doing MUCH better. Continue paxil. Continue to monitor Call with any concerns. Refills given.

## 2020-07-27 NOTE — Assessment & Plan Note (Signed)
Restart medicine. Checking labs today. Call with any concerns. Continue to monitor.

## 2020-07-27 NOTE — Assessment & Plan Note (Signed)
Rechecking labs today. Await results. Treat as needed.  °

## 2020-07-28 ENCOUNTER — Other Ambulatory Visit: Payer: Self-pay | Admitting: Family Medicine

## 2020-07-28 DIAGNOSIS — N184 Chronic kidney disease, stage 4 (severe): Secondary | ICD-10-CM

## 2020-07-28 LAB — COMPREHENSIVE METABOLIC PANEL
ALT: 16 IU/L (ref 0–32)
AST: 16 IU/L (ref 0–40)
Albumin/Globulin Ratio: 1.6 (ref 1.2–2.2)
Albumin: 3.4 g/dL — ABNORMAL LOW (ref 3.8–4.9)
Alkaline Phosphatase: 110 IU/L (ref 44–121)
BUN/Creatinine Ratio: 14 (ref 9–23)
BUN: 27 mg/dL — ABNORMAL HIGH (ref 6–24)
Bilirubin Total: 0.2 mg/dL (ref 0.0–1.2)
CO2: 17 mmol/L — ABNORMAL LOW (ref 20–29)
Calcium: 8.5 mg/dL — ABNORMAL LOW (ref 8.7–10.2)
Chloride: 109 mmol/L — ABNORMAL HIGH (ref 96–106)
Creatinine, Ser: 1.9 mg/dL — ABNORMAL HIGH (ref 0.57–1.00)
GFR calc Af Amer: 33 mL/min/{1.73_m2} — ABNORMAL LOW (ref >59)
GFR calc non Af Amer: 29 mL/min/{1.73_m2} — ABNORMAL LOW (ref >59)
Globulin, Total: 2.1 g/dL (ref 1.5–4.5)
Glucose: 112 mg/dL — ABNORMAL HIGH (ref 65–99)
Potassium: 3.8 mmol/L (ref 3.5–5.2)
Sodium: 139 mmol/L (ref 134–144)
Total Protein: 5.5 g/dL — ABNORMAL LOW (ref 6.0–8.5)

## 2020-07-28 LAB — CBC WITH DIFFERENTIAL/PLATELET
Basophils Absolute: 0.1 10*3/uL (ref 0.0–0.2)
Basos: 1 %
EOS (ABSOLUTE): 0.2 10*3/uL (ref 0.0–0.4)
Eos: 2 %
Hematocrit: 37.1 % (ref 34.0–46.6)
Hemoglobin: 12.4 g/dL (ref 11.1–15.9)
Immature Grans (Abs): 0 10*3/uL (ref 0.0–0.1)
Immature Granulocytes: 0 %
Lymphocytes Absolute: 2.5 10*3/uL (ref 0.7–3.1)
Lymphs: 20 %
MCH: 29.8 pg (ref 26.6–33.0)
MCHC: 33.4 g/dL (ref 31.5–35.7)
MCV: 89 fL (ref 79–97)
Monocytes Absolute: 0.8 10*3/uL (ref 0.1–0.9)
Monocytes: 7 %
Neutrophils Absolute: 8.9 10*3/uL — ABNORMAL HIGH (ref 1.4–7.0)
Neutrophils: 70 %
Platelets: 232 10*3/uL (ref 150–450)
RBC: 4.16 x10E6/uL (ref 3.77–5.28)
RDW: 12.6 % (ref 11.7–15.4)
WBC: 12.6 10*3/uL — ABNORMAL HIGH (ref 3.4–10.8)

## 2020-07-28 LAB — LIPID PANEL W/O CHOL/HDL RATIO
Cholesterol, Total: 247 mg/dL — ABNORMAL HIGH (ref 100–199)
HDL: 33 mg/dL — ABNORMAL LOW (ref >39)
LDL Chol Calc (NIH): 119 mg/dL — ABNORMAL HIGH (ref 0–99)
Triglycerides: 536 mg/dL — ABNORMAL HIGH (ref 0–149)
VLDL Cholesterol Cal: 95 mg/dL — ABNORMAL HIGH (ref 5–40)

## 2020-07-28 LAB — TSH: TSH: 3.43 u[IU]/mL (ref 0.450–4.500)

## 2020-07-28 MED ORDER — ATORVASTATIN CALCIUM 80 MG PO TABS: 80.0000 mg | ORAL_TABLET | Freq: Every day | ORAL | 1 refills | Status: DC

## 2020-08-04 ENCOUNTER — Ambulatory Visit: Payer: Self-pay | Admitting: *Deleted

## 2020-08-04 NOTE — Telephone Encounter (Signed)
Spoke to RN on phone and recommended patient go to UC if back pain and nausea, concern for kidney stone vs UTI.  Reviewed PCP recent, had UA last 07/27/20 and a referral was put to nephrology by PCP.  Would benefit urine and imaging with these symptoms, prior to treating.  If she does not go to UC over weekend then recommend she schedule in office for Monday.

## 2020-08-04 NOTE — Telephone Encounter (Addendum)
  C/o new onset back pain. Denies fever, urination difficulties, Patient reports she has had progressive low back pain and nausea and that is how most of her kidney infections start. UA completed on last OV.Reports urine color clear and yellow.  No medications prescribed. Kidney referral made by Dr. Wynetta Emery. Encouraged patient to go to UC and patient reports she does not have money for UC. On call provider notified and left message. Care advise given. Patient verbalized understanding of care advise and to call back or go to [facility] or ED if symptoms worsen.    Addendum: Call placed to on call provider, Ned Card, NP  and provider requested patient come back to office to obtain another urine sample. Last sample collected on 07/27/20. Called patient back and reviewed message from on call provider and to go to [facility] or ED if symptoms worsen. Instructed patient to call back when she wanted to make appt. Patient verbalized understanding.  Reason for Disposition . Side (flank) or lower back pain present  Answer Assessment - Initial Assessment Questions 1. SYMPTOM: "What's the main symptom you're concerned about?" (e.g., frequency, incontinence)     Low back pain from kidney infection 2. ONSET: "When did the  Pain   start?"      A couple of days ago  3. PAIN: "Is there any pain?" If Yes, ask: "How bad is it?" (Scale: 1-10; mild, moderate, severe)     Severe, can not hardly walk  4. CAUSE: "What do you think is causing the symptoms?"     Not sure  5. OTHER SYMPTOMS: "Do you have any other symptoms?" (e.g., fever, flank pain, blood in urine, pain with urination)     No just pain  6. PREGNANCY: "Is there any chance you are pregnant?" "When was your last menstrual period?"     na  Protocols used: URINARY [facility]

## 2020-08-09 ENCOUNTER — Telehealth: Payer: Self-pay

## 2020-08-09 NOTE — Telephone Encounter (Signed)
Called pt to get more details. Pt states that she has started a new job and has been out of work. She states that she is needing a work note from 11/1-11/8 to be able to go back to work. She states that she was seen 10/21 and provided a sample. Please advise if pt would need another appt.  Copied from [place] [id number]. Topic: General - Other >> Aug 08, 2020  9:46 AM Hinda Lenis D wrote: PT need a call back from Dr Wynetta Emery, personal matter / please advise

## 2020-08-09 NOTE — Telephone Encounter (Signed)
She would need an appointment. We do not give notes without being seen.

## 2020-08-10 NOTE — Telephone Encounter (Signed)
Called pt and advised of Dr Durenda Age message pt states she will call back to schedule

## 2020-09-15 ENCOUNTER — Ambulatory Visit: Payer: Self-pay | Admitting: *Deleted

## 2020-09-15 ENCOUNTER — Telehealth: Payer: Self-pay | Admitting: Family Medicine

## 2020-09-15 NOTE — Telephone Encounter (Signed)
Attempted to call pt. Left vm that nurse called to discuss her current symptoms.  Advised in vm that will send message to Dr. Wynetta Emery with pt's request for Clonazepam.  (not on active medication sheet)

## 2020-09-15 NOTE — Telephone Encounter (Signed)
Pt request refill clonazePAM (KLONOPIN) 0.5 MG tablet    [place], [place] Phone:  [phone]  Fax:  [phone]      Pt states Dr Wynetta Emery gave her a prescription for this to take when she needs, and she says she needs really bad. Pt states she is really stressed right now.

## 2020-09-15 NOTE — Telephone Encounter (Signed)
appt

## 2020-09-15 NOTE — Telephone Encounter (Signed)
Noted. FYI to Malachy Mood as she's seeing her Monday.
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
## 2020-09-18 ENCOUNTER — Telehealth: Payer: Self-pay | Admitting: Unknown Physician Specialty

## 2020-10-25 ENCOUNTER — Other Ambulatory Visit: Payer: 59

## 2020-10-25 DIAGNOSIS — Z20822 Contact with and (suspected) exposure to covid-19: Secondary | ICD-10-CM

## 2020-10-27 LAB — SARS-COV-2, NAA 2 DAY TAT

## 2020-10-27 LAB — NOVEL CORONAVIRUS, NAA: SARS-CoV-2, NAA: NOT DETECTED

## 2021-01-18 ENCOUNTER — Ambulatory Visit (INDEPENDENT_AMBULATORY_CARE_PROVIDER_SITE_OTHER): Payer: Self-pay | Admitting: Family Medicine

## 2021-01-18 ENCOUNTER — Encounter: Payer: Self-pay | Admitting: Family Medicine

## 2021-01-18 ENCOUNTER — Other Ambulatory Visit: Payer: Self-pay

## 2021-01-18 VITALS — BP 184/84 | HR 82 | Temp 97.8°F | Wt 157.0 lb

## 2021-01-18 DIAGNOSIS — M5410 Radiculopathy, site unspecified: Secondary | ICD-10-CM

## 2021-01-18 DIAGNOSIS — E7800 Pure hypercholesterolemia, unspecified: Secondary | ICD-10-CM

## 2021-01-18 DIAGNOSIS — R079 Chest pain, unspecified: Secondary | ICD-10-CM

## 2021-01-18 DIAGNOSIS — I10 Essential (primary) hypertension: Secondary | ICD-10-CM

## 2021-01-18 DIAGNOSIS — F410 Panic disorder [episodic paroxysmal anxiety] without agoraphobia: Secondary | ICD-10-CM

## 2021-01-18 MED ORDER — AMLODIPINE BESYLATE 10 MG PO TABS: 10.0000 mg | ORAL_TABLET | Freq: Every day | ORAL | 3 refills | Status: DC

## 2021-01-18 MED ORDER — ATORVASTATIN CALCIUM 80 MG PO TABS: 80.0000 mg | ORAL_TABLET | Freq: Every day | ORAL | 1 refills | Status: DC

## 2021-01-18 MED ORDER — PAROXETINE HCL 40 MG PO TABS
ORAL_TABLET | ORAL | 1 refills | Status: DC
Start: 2021-01-18 — End: 2021-07-26

## 2021-01-18 MED ORDER — GABAPENTIN 100 MG PO CAPS: 100.0000 mg | ORAL_CAPSULE | Freq: Three times a day (TID) | ORAL | 3 refills | Status: DC

## 2021-01-18 NOTE — Progress Notes (Signed)
BP (!) 184/84   Pulse 82   Temp 97.8 F (36.6 C)   Wt 157 lb (71.2 kg)   SpO2 98%   BMI 28.53 kg/m    Subjective:    Patient ID: Brittany Jacobson, female    DOB: October 30, 1960, 60 y.o.   MRN: [id number]  HPI: Brittany Jacobson is a 60 y.o. female  Chief Complaint  Patient presents with  . Leg Pain    Patient states she is having right leg pain, pain radiates to hip, all the way down leg to foot. Patient states she occasionally has left foot pain as well. Patient states she has tried otc ibuprofen but doesn't seem to help.    LEG PAIN Duration: few months ago Mechanism of injury: unknown Location: From her hip all the way down her leg Onset: gradual Severity: severe Quality: dull aching, prickly  Frequency: with movement Radiation: R leg below the knee Aggravating factors: movement Alleviating factors: rest Status: worse Treatments attempted: rest, ibuprofen   Relief with NSAIDs?: no Nighttime pain:  yes Paresthesias / decreased sensation:  yes Bowel / bladder incontinence:  no Fevers:  no Dysuria / urinary frequency:  no   HYPERTENSION Hypertension status: uncontrolled  Satisfied with current treatment? no Duration of hypertension: chronic BP monitoring frequency:  not checking BP medication side effects:  no Medication compliance: fair compliance Previous BP meds: amlodipine Aspirin: no Recurrent headaches: no Visual changes: no Palpitations: yes Dyspnea: yes Chest pain: yes Lower extremity edema: no Dizzy/lightheaded: yes  CHEST PAIN Duration: over a month Onset: sudden Quality: sharp Severity: severe Location: left para substernal Radiation: left arm Episode duration: 20-30 minutes Frequency: intermittent Related to exertion: yes Trauma: no Anxiety/recent stressors: yes Status: fluctuating Treatments attempted: nothing  Current pain status: in pain Shortness of breath: yes Cough: yes Nausea: yes Diaphoresis: no Heartburn: no Palpitations:  yes   Relevant past medical, surgical, family and social history reviewed and updated as indicated. Interim medical history since our last visit reviewed. Allergies and medications reviewed and updated.  Review of Systems  Constitutional: Negative.   Respiratory: Negative.   Cardiovascular: Negative.   Gastrointestinal: Negative.   Musculoskeletal: Negative.   Neurological: Negative.   Psychiatric/Behavioral: Negative.     Per HPI unless specifically indicated above     Objective:    BP (!) 184/84   Pulse 82   Temp 97.8 F (36.6 C)   Wt 157 lb (71.2 kg)   SpO2 98%   BMI 28.53 kg/m   Wt Readings from Last 3 Encounters:  01/18/21 157 lb (71.2 kg)  07/27/20 161 lb (73 kg)  03/07/20 155 lb 9.6 oz (70.6 kg)    Physical Exam Vitals and nursing note reviewed.  Constitutional:      General: She is not in acute distress.    Appearance: Normal appearance. She is not ill-appearing, toxic-appearing or diaphoretic.  HENT:     Head: Normocephalic and atraumatic.     Right Ear: External ear normal.     Left Ear: External ear normal.     Nose: Nose normal.     Mouth/Throat:     Mouth: Mucous membranes are moist.     Pharynx: Oropharynx is clear.  Eyes:     General: No scleral icterus.       Right eye: No discharge.        Left eye: No discharge.     Extraocular Movements: Extraocular movements intact.     Conjunctiva/sclera: Conjunctivae normal.  Pupils: Pupils are equal, round, and reactive to light.  Cardiovascular:     Rate and Rhythm: Normal rate and regular rhythm.     Pulses: Normal pulses.     Heart sounds: Normal heart sounds. No murmur heard. No friction rub. No gallop.   Pulmonary:     Effort: Pulmonary effort is normal. No respiratory distress.     Breath sounds: Normal breath sounds. No stridor. No wheezing, rhonchi or rales.  Chest:     Chest wall: No tenderness.  Musculoskeletal:        General: Normal range of motion.     Cervical back: Normal range  of motion and neck supple.  Skin:    General: Skin is warm and dry.     Capillary Refill: Capillary refill takes less than 2 seconds.     Coloration: Skin is not jaundiced or pale.     Findings: No bruising, erythema, lesion or rash.  Neurological:     General: No focal deficit present.     Mental Status: She is alert and oriented to person, place, and time. Mental status is at baseline.  Psychiatric:        Mood and Affect: Mood normal.        Behavior: Behavior normal.        Thought Content: Thought content normal.        Judgment: Judgment normal.     Results for orders placed or performed in visit on 01/18/21  Comprehensive metabolic panel  Result Value Ref Range   Glucose 114 (H) 65 - 99 mg/dL   BUN 27 (H) 6 - 24 mg/dL   Creatinine, Ser 2.21 (H) 0.57 - 1.00 mg/dL   eGFR 25 (L) >59 mL/min/1.73   BUN/Creatinine Ratio 12 9 - 23   Sodium 142 134 - 144 mmol/L   Potassium 3.3 (L) 3.5 - 5.2 mmol/L   Chloride 107 (H) 96 - 106 mmol/L   CO2 18 (L) 20 - 29 mmol/L   Calcium 8.3 (L) 8.7 - 10.2 mg/dL   Total Protein 5.8 (L) 6.0 - 8.5 g/dL   Albumin 3.5 (L) 3.8 - 4.9 g/dL   Globulin, Total 2.3 1.5 - 4.5 g/dL   Albumin/Globulin Ratio 1.5 1.2 - 2.2   Bilirubin Total 0.2 0.0 - 1.2 mg/dL   Alkaline Phosphatase 96 44 - 121 IU/L   AST 14 0 - 40 IU/L   ALT 10 0 - 32 IU/L  Lipid Panel w/o Chol/HDL Ratio  Result Value Ref Range   Cholesterol, Total 257 (H) 100 - 199 mg/dL   Triglycerides 365 (H) 0 - 149 mg/dL   HDL 31 (L) >39 mg/dL   VLDL Cholesterol Cal 69 (H) 5 - 40 mg/dL   LDL Chol Calc (NIH) 157 (H) 0 - 99 mg/dL      Assessment & Plan:   Problem List Items Addressed This Visit      Cardiovascular and Mediastinum   Hypertension    Not under good control. Will increase her amlodpine to 66m and recheck 2 weeks.       Relevant Medications   amLODipine (NORVASC) 10 MG tablet   atorvastatin (LIPITOR) 80 MG tablet   Other Relevant Orders   Comprehensive metabolic panel  (Completed)     Other   Panic attacks    Under good control on current regimen. Continue current regimen. Continue to monitor. Call with any concerns. Refills given.        Relevant Medications   PARoxetine (  PAXIL) 40 MG tablet   Hypercholesteremia    Under good control on current regimen. Continue current regimen. Continue to monitor. Call with any concerns. Refills given.        Relevant Medications   amLODipine (NORVASC) 10 MG tablet   atorvastatin (LIPITOR) 80 MG tablet   Other Relevant Orders   Comprehensive metabolic panel (Completed)   Lipid Panel w/o Chol/HDL Ratio (Completed)    Other Visit Diagnoses    Radicular leg pain    -  Primary   Will get her x-rays and start gabapentin. Recheck 2 weeks. Call with any concerns.    Relevant Orders   DG Lumbar Spine Complete   DG Hip Unilat W OR W/O Pelvis 2-3 Views Right   Chest pain, unspecified type       Given risk factors and chest pain will get her into cardiology ASAP. Await their input.    Relevant Orders   EKG 12-Lead (Completed)   Ambulatory referral to Cardiology       Follow up plan: Return in about 2 weeks (around 02/01/2021).

## 2021-01-19 LAB — COMPREHENSIVE METABOLIC PANEL
ALT: 10 IU/L (ref 0–32)
AST: 14 IU/L (ref 0–40)
Albumin/Globulin Ratio: 1.5 (ref 1.2–2.2)
Albumin: 3.5 g/dL — ABNORMAL LOW (ref 3.8–4.9)
Alkaline Phosphatase: 96 IU/L (ref 44–121)
BUN/Creatinine Ratio: 12 (ref 9–23)
BUN: 27 mg/dL — ABNORMAL HIGH (ref 6–24)
Bilirubin Total: 0.2 mg/dL (ref 0.0–1.2)
CO2: 18 mmol/L — ABNORMAL LOW (ref 20–29)
Calcium: 8.3 mg/dL — ABNORMAL LOW (ref 8.7–10.2)
Chloride: 107 mmol/L — ABNORMAL HIGH (ref 96–106)
Creatinine, Ser: 2.21 mg/dL — ABNORMAL HIGH (ref 0.57–1.00)
Globulin, Total: 2.3 g/dL (ref 1.5–4.5)
Glucose: 114 mg/dL — ABNORMAL HIGH (ref 65–99)
Potassium: 3.3 mmol/L — ABNORMAL LOW (ref 3.5–5.2)
Sodium: 142 mmol/L (ref 134–144)
Total Protein: 5.8 g/dL — ABNORMAL LOW (ref 6.0–8.5)
eGFR: 25 mL/min/{1.73_m2} — ABNORMAL LOW (ref >59)

## 2021-01-19 LAB — LIPID PANEL W/O CHOL/HDL RATIO
Cholesterol, Total: 257 mg/dL — ABNORMAL HIGH (ref 100–199)
HDL: 31 mg/dL — ABNORMAL LOW (ref >39)
LDL Chol Calc (NIH): 157 mg/dL — ABNORMAL HIGH (ref 0–99)
Triglycerides: 365 mg/dL — ABNORMAL HIGH (ref 0–149)
VLDL Cholesterol Cal: 69 mg/dL — ABNORMAL HIGH (ref 5–40)

## 2021-01-20 ENCOUNTER — Other Ambulatory Visit: Payer: Self-pay | Admitting: Family Medicine

## 2021-01-20 DIAGNOSIS — N289 Disorder of kidney and ureter, unspecified: Secondary | ICD-10-CM

## 2021-01-21 ENCOUNTER — Encounter: Payer: Self-pay | Admitting: Family Medicine

## 2021-01-21 NOTE — Assessment & Plan Note (Signed)
Under good control on current regimen. Continue current regimen. Continue to monitor. Call with any concerns. Refills given.   

## 2021-01-21 NOTE — Progress Notes (Signed)
Interpreted by me on 01/18/21. NSR at 68bpm with no ST segment changes.

## 2021-01-21 NOTE — Assessment & Plan Note (Signed)
Not under good control. Will increase her amlodpine to '10mg'$  and recheck 2 weeks.

## 2021-01-22 ENCOUNTER — Encounter: Payer: Self-pay | Admitting: Family Medicine

## 2021-01-22 ENCOUNTER — Telehealth: Payer: Self-pay | Admitting: Family Medicine

## 2021-01-22 ENCOUNTER — Other Ambulatory Visit: Payer: Self-pay | Admitting: Family Medicine

## 2021-01-22 MED ORDER — EZETIMIBE 10 MG PO TABS
10.0000 mg | ORAL_TABLET | Freq: Every day | ORAL | 3 refills | Status: DC
Start: 2021-01-22 — End: 2021-07-26

## 2021-01-22 NOTE — Telephone Encounter (Signed)
Called pt advised to go get x-rays and that letter is in pt verbalized understanding will keep 4/28 appt

## 2021-01-22 NOTE — Telephone Encounter (Signed)
Pt is calling because she is not able to put a  shoe on her right foot due to the fact she was limping with the leg pain. Her baby toe is swollen and hurts bad. Pt believes that the pills are helping. She elevates her foot all day and soaks it in the foot bath. Pt is requesting a note for work. Pt does not know when she will be able to return back to work. Pt states that for her job is she on her feet all night long. Pt even bought special shoes to help with the pain.   Pt would also like to know what can improve her kidneys. CB- [phone]

## 2021-01-22 NOTE — Telephone Encounter (Signed)
Please make sure she goes to get her x-rays.

## 2021-01-22 NOTE — Telephone Encounter (Signed)
Pt had apt on 01/18/2021 would she need another apt?

## 2021-01-22 NOTE — Telephone Encounter (Signed)
Please advise 

## 2021-01-22 NOTE — Telephone Encounter (Signed)
Pt scheduled for 01/25/2021 doe she need this apt? Pt also has questions on labs.

## 2021-01-22 NOTE — Telephone Encounter (Signed)
She needs an appointment 2 weeks out from her last- not 1. She needs to go get her x-rays. Letter written and on her chart.

## 2021-01-25 ENCOUNTER — Other Ambulatory Visit: Payer: Self-pay

## 2021-01-25 ENCOUNTER — Emergency Department
Admission: EM | Admit: 2021-01-25 | Discharge: 2021-01-26 | Disposition: A | Discharge status: Home / Self Care | Payer: Medicaid Other | Attending: Emergency Medicine | Admitting: Emergency Medicine

## 2021-01-25 ENCOUNTER — Ambulatory Visit: Payer: Medicaid Other | Admitting: Family Medicine

## 2021-01-25 ENCOUNTER — Encounter: Payer: Medicaid Other | Admitting: Family Medicine

## 2021-01-25 ENCOUNTER — Emergency Department: Payer: Medicaid Other

## 2021-01-25 DIAGNOSIS — M25551 Pain in right hip: Secondary | ICD-10-CM | POA: Diagnosis not present

## 2021-01-25 DIAGNOSIS — S90931A Unspecified superficial injury of right great toe, initial encounter: Secondary | ICD-10-CM | POA: Insufficient documentation

## 2021-01-25 DIAGNOSIS — F17210 Nicotine dependence, cigarettes, uncomplicated: Secondary | ICD-10-CM | POA: Insufficient documentation

## 2021-01-25 DIAGNOSIS — I10 Essential (primary) hypertension: Secondary | ICD-10-CM | POA: Diagnosis not present

## 2021-01-25 DIAGNOSIS — M25552 Pain in left hip: Secondary | ICD-10-CM | POA: Diagnosis not present

## 2021-01-25 DIAGNOSIS — S99921A Unspecified injury of right foot, initial encounter: Secondary | ICD-10-CM

## 2021-01-25 DIAGNOSIS — X58XXXA Exposure to other specified factors, initial encounter: Secondary | ICD-10-CM | POA: Diagnosis not present

## 2021-01-25 DIAGNOSIS — Z79899 Other long term (current) drug therapy: Secondary | ICD-10-CM | POA: Insufficient documentation

## 2021-01-25 NOTE — ED Triage Notes (Addendum)
Pt arrived via POV with reports of R pinky toe pain x 4-5 days, denies any injury.

## 2021-01-26 MED ORDER — DOXYCYCLINE HYCLATE 100 MG PO CAPS: 100.0000 mg | ORAL_CAPSULE | Freq: Two times a day (BID) | ORAL | 0 refills | Status: AC

## 2021-01-26 MED ORDER — DOXYCYCLINE HYCLATE 100 MG PO TABS
100.0000 mg | ORAL_TABLET | Freq: Once | ORAL | Status: AC
  Administered 2021-01-26: 100 mg via ORAL
  Filled 2021-01-26: qty 1

## 2021-01-26 NOTE — ED Provider Notes (Signed)
[facility] Emergency Department Provider Note  ____________________________________________   Event Date/Time   First MD Initiated Contact with Patient 01/25/21 2328     (approximate)  I have reviewed the triage vital signs and the nursing notes.   HISTORY  Chief Complaint Toe Pain    HPI Brittany Jacobson is a 60 y.o. female who presents for evaluation of pain in her right little toe.  It started 4 to 5 days ago.  She said that she has been having pain in both of her hips and her legs for which she saw her primary care doctor and was started on gabapentin.  She believes that she has been "walking funny" as result of her leg pain and putting some excessive pressure on her right little toe.  She thought she might of also had ingrown toenail and has been picking at the skin around the nail.  She has developed some redness and swelling of the toe and it is painful to bear weight although she is able to ambulate without difficulty.  She denies fever, nausea, vomiting, and any other symptom.  She is not sure whether the gabapentin is working or not.  She does not have diabetes.         Past Medical History:  Diagnosis Date  . Agoraphobia   . Anxiety   . Hypercholesteremia   . Hypertension   . Panic attacks   . Panic attacks   . Reflux     Patient Active Problem List   Diagnosis Date Noted  . IFG (impaired fasting glucose) 03/07/2020  . [place] 12/19/2017  . Panic attacks   . Hypercholesteremia   . Hypertension     No past surgical history on file.  Prior to Admission medications   Medication Sig Start Date End Date Taking? Authorizing Provider  doxycycline (VIBRAMYCIN) 100 MG capsule Take 1 capsule (100 mg total) by mouth 2 (two) times daily for 10 days. 01/26/21 02/05/21 Yes Hinda Kehr, MD  albuterol (VENTOLIN HFA) 108 (90 Base) MCG/ACT inhaler Inhale 2 puffs into the lungs every 6 (six) hours as needed for wheezing or shortness of breath.  07/27/20   Johnson, Megan P, DO  amLODipine (NORVASC) 10 MG tablet Take 1 tablet (10 mg total) by mouth daily. 01/18/21   Johnson, Megan P, DO  atorvastatin (LIPITOR) 80 MG tablet Take 1 tablet (80 mg total) by mouth daily. TAKE 1 TABLET BY MOUTH ONCE DAILY FOR CHOLESTEROL 01/18/21   Johnson, Megan P, DO  budesonide-formoterol (SYMBICORT) 160-4.5 MCG/ACT inhaler Inhale 2 puffs into the lungs 2 (two) times daily. 07/27/20   Johnson, Megan P, DO  ezetimibe (ZETIA) 10 MG tablet Take 1 tablet (10 mg total) by mouth daily. 01/22/21   Johnson, Megan P, DO  gabapentin (NEURONTIN) 100 MG capsule Take 1 capsule (100 mg total) by mouth 3 (three) times daily. 01/18/21   Johnson, Megan P, DO  hydrOXYzine (VISTARIL) 25 MG capsule Take 1 capsule (25 mg total) by mouth 3 (three) times daily as needed. Patient not taking: Reported on 01/18/2021 07/27/20   Park Liter P, DO  PARoxetine (PAXIL) 40 MG tablet TAKE 1 TABLET BY MOUTH ONCE DAILY FOR STRESS. 01/18/21   Park Liter P, DO    Allergies Codeine  Family History  Problem Relation Age of Onset  . Cancer Mother        Throat  . Hyperlipidemia Father   . Hypertension Father   . Heart disease Father   . Heart  attack Father   . Mental illness Father        Panic Attacks  . Mental illness Sister        Panic Attacks  . Cancer Sister        uterine  . Mental illness Son        Bipolar  . Heart disease Maternal Grandmother   . Cancer Maternal Grandfather   . Hyperlipidemia Paternal Grandmother   . Hypertension Paternal Grandmother   . Heart attack Paternal Grandfather   . Hypertension Paternal Grandfather   . Hyperlipidemia Paternal Grandfather   . Mental illness Sister        Panic Attacks  . Dementia Paternal Aunt     Social History Social History   Tobacco Use  . Smoking status: Current Every Day Smoker    Packs/day: 1.00    Types: Cigarettes  . Smokeless tobacco: Never Used  Vaping Use  . Vaping Use: Never used  Substance Use  Topics  . Alcohol use: Yes    Comment: occassional  . Drug use: No    Review of Systems Constitutional: No fever/chills Respiratory: Denies shortness of breath. Gastrointestinal: No abdominal pain.  No nausea, no vomiting.   Musculoskeletal: Positive for redness and swelling of the right little toe. Integumentary: Positive for redness and swelling of the right little toe. Neurological: Negative for headaches, focal weakness or numbness.   ____________________________________________   PHYSICAL EXAM:  VITAL SIGNS: ED Triage Vitals  Enc Vitals Group     BP 01/25/21 2112 (!) 158/83     Pulse Rate 01/25/21 2112 79     Resp 01/25/21 2112 20     Temp 01/25/21 2112 98.3 F (36.8 C)     Temp Source 01/25/21 2112 Oral     SpO2 01/25/21 2112 98 %     Weight 01/25/21 2112 71.2 kg (157 lb)     Height 01/25/21 2112 1.6 m ('5\' 3"'$ )     Head Circumference --      Peak Flow --      Pain Score 01/25/21 2111 10     Pain Loc --      Pain Edu? --      Excl. in [place]? --     Constitutional: Alert and oriented.  Eyes: Conjunctivae are normal.  Head: Atraumatic. Nose: No congestion/rhinnorhea. Mouth/Throat: Patient is wearing a mask. Neck: No stridor.  No meningeal signs.   Cardiovascular: Normal rate, regular rhythm. Good peripheral circulation. Respiratory: Normal respiratory effort.  No retractions. Gastrointestinal: Soft and nondistended. Musculoskeletal: The patient's right little toe is slightly swollen and erythematous with some pain with manipulation.  Possible infectious process but no purulence, induration, or drainable fluid collection.  There is some dried blood around the nail and she has trimmed away the lateral part of the nail that she thought might have been ingrown.  No diabetic foot ulcers. Neurologic:  Normal speech and language. No gross focal neurologic deficits are appreciated.  Skin:  Skin is warm, dry and intact.  See musculoskeletal exam above for details about the  right little toe. Psychiatric: Mood and affect are normal. Speech and behavior are normal.  ____________________________________________   LABS (all labs ordered are listed, but only abnormal results are displayed)  Labs Reviewed - No data to display ____________________________________________   RADIOLOGY I, Hinda Kehr, personally viewed and evaluated these images (plain radiographs) as part of my medical decision making, as well as reviewing the written report by the radiologist.  ED MD  interpretation: Normal radiographs of the right little toe.  Official radiology report(s): DG Toe 5th Right  Result Date: 01/25/2021 CLINICAL DATA:  Right 5th toe pain. EXAM: RIGHT FIFTH TOE COMPARISON:  None. FINDINGS: There is no evidence of fracture or dislocation. There is no evidence of arthropathy or other focal bone abnormality. Soft tissues are unremarkable. IMPRESSION: Negative. Electronically Signed   By: Rolm Baptise M.D.   On: 01/25/2021 21:55    ____________________________________________   PROCEDURES   Procedure(s) performed (including Critical Care):  Procedures   ____________________________________________   INITIAL IMPRESSION / MDM / [place] / ED COURSE  As part of my medical decision making, I reviewed the following data within the [place] notes reviewed and incorporated, Radiograph reviewed  and Notes from prior ED visits  I personally reviewed the patient's imaging and agree with the radiologist's interpretation that there is no evidence of bony involvement such as osteomyelitis and no fracture or dislocation.  I believe that the patient has been walking with a atypical gait for her as a result of her leg pain and now she has some inflammatory and possibly infectious changes.  She no longer has an ingrown toenail but she may have trimmed that away.  I provided a hard soled shoe and a course of doxycycline and given the  possibility of an infection but I strongly encouraged her to follow-up with podiatry for further management and she understands and agrees with the plan.  I recommended continued use of her gabapentin as well as over-the-counter ibuprofen and Tylenol and I gave my usual and customary return precautions.  No evidence of emergent medical condition that requires further evaluation or treatment at this time.          ____________________________________________  FINAL CLINICAL IMPRESSION(S) / ED DIAGNOSES  Final diagnoses:  Injury of right toe, initial encounter     MEDICATIONS GIVEN DURING THIS VISIT:  Medications  doxycycline (VIBRA-TABS) tablet 100 mg (100 mg Oral Given 01/26/21 0112)     ED Discharge Orders         Ordered    doxycycline (VIBRAMYCIN) 100 MG capsule  2 times daily        01/26/21 0136          *Please note:  Brittany Jacobson was evaluated in Emergency Department on 01/26/2021 for the symptoms described in the history of present illness. She was evaluated in the context of the global COVID-19 pandemic, which necessitated consideration that the patient might be at risk for infection with the SARS-CoV-2 virus that causes COVID-19. Institutional protocols and algorithms that pertain to the evaluation of patients at risk for COVID-19 are in a state of rapid change based on information released by regulatory bodies including the CDC and federal and state organizations. These policies and algorithms were followed during the patient's care in the ED.  Some ED evaluations and interventions may be delayed as a result of limited staffing during and after the pandemic.*  Note:  This document was prepared using Dragon voice recognition software and may include unintentional dictation errors.   Hinda Kehr, MD 01/26/21 [id number]

## 2021-01-26 NOTE — Discharge Instructions (Signed)
As we discussed, we are treating you with antibiotics for the possibility of a developing infection and the little toe of your right foot.  Fortunately the x-rays are reassuring.  Please use the provided orthopedic shoe and follow-up with Dr. Vickki Muff or one of his podiatry colleagues.  Take the medication as recommended and try to minimize weightbearing on your foot when possible.  You may want to keep your foot elevated when you are not needing to be on your feet.    Return to the emergency department if you develop new or worsening symptoms that concern you.

## 2021-02-01 ENCOUNTER — Ambulatory Visit (INDEPENDENT_AMBULATORY_CARE_PROVIDER_SITE_OTHER): Payer: Self-pay | Admitting: Family Medicine

## 2021-02-01 ENCOUNTER — Ambulatory Visit
Admission: RE | Admit: 2021-02-01 | Discharge: 2021-02-01 | Disposition: A | Discharge status: Home / Self Care | Payer: Medicaid Other | Attending: Family Medicine | Admitting: Family Medicine

## 2021-02-01 ENCOUNTER — Ambulatory Visit
Admission: RE | Admit: 2021-02-01 | Discharge: 2021-02-01 | Disposition: A | Discharge status: Home / Self Care | Payer: Medicaid Other | Source: Ambulatory Visit | Attending: Family Medicine | Admitting: Family Medicine

## 2021-02-01 ENCOUNTER — Encounter: Payer: Self-pay | Admitting: Family Medicine

## 2021-02-01 ENCOUNTER — Other Ambulatory Visit: Payer: Self-pay

## 2021-02-01 VITALS — BP 151/75 | HR 86 | Temp 98.2°F | Wt 156.4 lb

## 2021-02-01 DIAGNOSIS — M5410 Radiculopathy, site unspecified: Secondary | ICD-10-CM

## 2021-02-01 DIAGNOSIS — N289 Disorder of kidney and ureter, unspecified: Secondary | ICD-10-CM

## 2021-02-01 DIAGNOSIS — I10 Essential (primary) hypertension: Secondary | ICD-10-CM

## 2021-02-01 DIAGNOSIS — N184 Chronic kidney disease, stage 4 (severe): Secondary | ICD-10-CM | POA: Insufficient documentation

## 2021-02-01 DIAGNOSIS — E7800 Pure hypercholesterolemia, unspecified: Secondary | ICD-10-CM

## 2021-02-01 DIAGNOSIS — R079 Chest pain, unspecified: Secondary | ICD-10-CM

## 2021-02-01 MED ORDER — GABAPENTIN 300 MG PO CAPS: 300.0000 mg | ORAL_CAPSULE | Freq: Three times a day (TID) | ORAL | 3 refills | Status: DC

## 2021-02-01 MED ORDER — ASPIRIN 81 MG PO TBEC: 81.0000 mg | DELAYED_RELEASE_TABLET | Freq: Every day | ORAL | 4 refills | Status: AC

## 2021-02-01 MED ORDER — KETOROLAC TROMETHAMINE 60 MG/2ML IM SOLN
60.0000 mg | Freq: Once | INTRAMUSCULAR | Status: AC
  Administered 2021-02-01: 60 mg via INTRAMUSCULAR

## 2021-02-01 NOTE — Patient Instructions (Addendum)
Make sure you're taking your atorvastatin and your zetia for your cholesterol.  You should only be taking 10 mg of your amlodipine- either 2 '5mg'$  tabs or 1 '10mg'$  tab, not '15mg'$    You can take 3 of your '100mg'$  gabapentin 3x a day ('300mg'$ ) until you run out, then you can take 1 of your '300mg'$  gabapentins that I sent to your pharmacy  Make sure to keep your appointment with the cardiologist on 02/08/21 11AM Address: [address], [place], [place] [address] Phone: [phone]

## 2021-02-01 NOTE — Progress Notes (Signed)
BP (!) 151/75   Pulse 86   Temp 98.2 F (36.8 C)   Wt 156 lb 6.4 oz (70.9 kg)   SpO2 97%   BMI 27.71 kg/m    Subjective:    Patient ID: Brittany Jacobson, female    DOB: August 23, 1961, 60 y.o.   MRN: [id number]  HPI: Brittany Jacobson is a 60 y.o. female  Chief Complaint  Patient presents with  . kidney function    Follow up labs   . Hyperlipidemia    Follow up   . x-rays    Follow up, would like to know results.    HYPERTENSION / HYPERLIPIDEMIA- didn't take her pills today, only been taking her 49m for 3 days Satisfied with current treatment? yes Duration of hypertension: chronic BP monitoring frequency: not checking BP medication side effects: no Past BP meds: amlodipine Duration of hyperlipidemia: chronic Cholesterol medication side effects: no Cholesterol supplements: none Past cholesterol medications: atorvastatin, zetia Medication compliance: fair compliance Aspirin: no Recent stressors: yes Recurrent headaches: no Visual changes: no Palpitations: no Dyspnea: yes Chest pain: yes Lower extremity edema: no Dizzy/lightheaded: no  BACK PAIN Duration: few months Mechanism of injury: unknown Location: R hip down her leg Onset: gradual Severity: severe Quality: dull, aching, prickly Frequency: with movement Radiation: R leg below the knee Aggravating factors: movement Alleviating factors: rest Status: worse Treatments attempted: rest and ibuprofen  Relief with NSAIDs?: no Nighttime pain:  yes Paresthesias / decreased sensation:  yes Bowel / bladder incontinence:  no Fevers:  no Dysuria / urinary frequency:  no   Relevant past medical, surgical, family and social history reviewed and updated as indicated. Interim medical history since our last visit reviewed. Allergies and medications reviewed and updated.  Review of Systems  Constitutional: Negative.   Respiratory: Positive for chest tightness and shortness of breath. Negative for apnea, cough,  choking, wheezing and stridor.   Cardiovascular: Positive for chest pain. Negative for palpitations and leg swelling.  Musculoskeletal: Positive for back pain and myalgias. Negative for arthralgias, gait problem, joint swelling, neck pain and neck stiffness.  Skin: Negative.   Neurological: Negative.   Psychiatric/Behavioral: Negative.     Per HPI unless specifically indicated above     Objective:    BP (!) 151/75   Pulse 86   Temp 98.2 F (36.8 C)   Wt 156 lb 6.4 oz (70.9 kg)   SpO2 97%   BMI 27.71 kg/m   Wt Readings from Last 3 Encounters:  02/01/21 156 lb 6.4 oz (70.9 kg)  01/25/21 157 lb (71.2 kg)  01/18/21 157 lb (71.2 kg)    Physical Exam Vitals and nursing note reviewed.  Constitutional:      General: She is not in acute distress.    Appearance: Normal appearance. She is not ill-appearing, toxic-appearing or diaphoretic.  HENT:     Head: Normocephalic and atraumatic.     Right Ear: External ear normal.     Left Ear: External ear normal.     Nose: Nose normal.     Mouth/Throat:     Mouth: Mucous membranes are moist.     Pharynx: Oropharynx is clear.  Eyes:     General: No scleral icterus.       Right eye: No discharge.        Left eye: No discharge.     Extraocular Movements: Extraocular movements intact.     Conjunctiva/sclera: Conjunctivae normal.     Pupils: Pupils are equal, round, and reactive  to light.  Cardiovascular:     Rate and Rhythm: Normal rate and regular rhythm.     Pulses: Normal pulses.     Heart sounds: Normal heart sounds. No murmur heard. No friction rub. No gallop.   Pulmonary:     Effort: Pulmonary effort is normal. No respiratory distress.     Breath sounds: Normal breath sounds. No stridor. No wheezing, rhonchi or rales.  Chest:     Chest wall: No tenderness.  Musculoskeletal:        General: Normal range of motion.     Cervical back: Normal range of motion and neck supple.  Skin:    General: Skin is warm and dry.      Capillary Refill: Capillary refill takes less than 2 seconds.     Coloration: Skin is not jaundiced or pale.     Findings: No bruising, erythema, lesion or rash.  Neurological:     General: No focal deficit present.     Mental Status: She is alert and oriented to person, place, and time. Mental status is at baseline.     Gait: Gait abnormal.  Psychiatric:        Mood and Affect: Mood normal.        Behavior: Behavior normal.        Thought Content: Thought content normal.        Judgment: Judgment normal.     Results for orders placed or performed in visit on 01/18/21  Comprehensive metabolic panel  Result Value Ref Range   Glucose 114 (H) 65 - 99 mg/dL   BUN 27 (H) 6 - 24 mg/dL   Creatinine, Ser 2.21 (H) 0.57 - 1.00 mg/dL   eGFR 25 (L) >59 mL/min/1.73   BUN/Creatinine Ratio 12 9 - 23   Sodium 142 134 - 144 mmol/L   Potassium 3.3 (L) 3.5 - 5.2 mmol/L   Chloride 107 (H) 96 - 106 mmol/L   CO2 18 (L) 20 - 29 mmol/L   Calcium 8.3 (L) 8.7 - 10.2 mg/dL   Total Protein 5.8 (L) 6.0 - 8.5 g/dL   Albumin 3.5 (L) 3.8 - 4.9 g/dL   Globulin, Total 2.3 1.5 - 4.5 g/dL   Albumin/Globulin Ratio 1.5 1.2 - 2.2   Bilirubin Total 0.2 0.0 - 1.2 mg/dL   Alkaline Phosphatase 96 44 - 121 IU/L   AST 14 0 - 40 IU/L   ALT 10 0 - 32 IU/L  Lipid Panel w/o Chol/HDL Ratio  Result Value Ref Range   Cholesterol, Total 257 (H) 100 - 199 mg/dL   Triglycerides 365 (H) 0 - 149 mg/dL   HDL 31 (L) >39 mg/dL   VLDL Cholesterol Cal 69 (H) 5 - 40 mg/dL   LDL Chol Calc (NIH) 157 (H) 0 - 99 mg/dL      Assessment & Plan:   Problem List Items Addressed This Visit      Cardiovascular and Mediastinum   Hypertension    Not under good control but hasn't taken her medicine today- will make sure she takes her medicine daily. Has appt to see cardiology next week- will check to see what her BP is there and may need to add medicine as needed. Checking BMP today. Await results.       Relevant Medications   aspirin 81  MG EC tablet     Genitourinary   CKD (chronic kidney disease) stage 4, GFR 15-29 ml/min (HCC)    Rechecking labs today. Likely will need to  see nephrology. Await results.         Other   Hypercholesteremia    Just started on zetia last visit 2 weeks ago- too soon to recheck. Will recheck next visit.       Relevant Medications   aspirin 81 MG EC tablet    Other Visit Diagnoses    Radicular leg pain    -  Primary   X-rays were normal. Will get her into PT for evaluation and increase her gabapentin to 365m TID. May need MRI if not improving.    Relevant Medications   ketorolac (TORADOL) injection 60 mg (Start on 02/01/2021  4:45 PM)   Other Relevant Orders   Ambulatory referral to Physical Therapy   Chest pain, unspecified type       Warning signs again discussed. Seeing cardiology in 1 week. Continue to monitor.    Abnormal kidney function       Rechecking labs today. Await results. Treat as needed.        Follow up plan: Return 2-3 weeks.

## 2021-02-01 NOTE — Assessment & Plan Note (Signed)
Just started on zetia last visit 2 weeks ago- too soon to recheck. Will recheck next visit.

## 2021-02-01 NOTE — Assessment & Plan Note (Signed)
Rechecking labs today. Likely will need to see nephrology. Await results.

## 2021-02-01 NOTE — Assessment & Plan Note (Signed)
Not under good control but hasn't taken her medicine today- will make sure she takes her medicine daily. Has appt to see cardiology next week- will check to see what her BP is there and may need to add medicine as needed. Checking BMP today. Await results.

## 2021-02-02 ENCOUNTER — Other Ambulatory Visit: Payer: Self-pay | Admitting: Family Medicine

## 2021-02-02 ENCOUNTER — Telehealth: Payer: Self-pay

## 2021-02-02 DIAGNOSIS — N184 Chronic kidney disease, stage 4 (severe): Secondary | ICD-10-CM

## 2021-02-02 LAB — BASIC METABOLIC PANEL
BUN/Creatinine Ratio: 14 (ref 9–23)
BUN: 34 mg/dL — ABNORMAL HIGH (ref 6–24)
CO2: 17 mmol/L — ABNORMAL LOW (ref 20–29)
Calcium: 8.1 mg/dL — ABNORMAL LOW (ref 8.7–10.2)
Chloride: 110 mmol/L — ABNORMAL HIGH (ref 96–106)
Creatinine, Ser: 2.37 mg/dL — ABNORMAL HIGH (ref 0.57–1.00)
Glucose: 107 mg/dL — ABNORMAL HIGH (ref 65–99)
Potassium: 3.7 mmol/L (ref 3.5–5.2)
Sodium: 143 mmol/L (ref 134–144)
eGFR: 23 mL/min/{1.73_m2} — ABNORMAL LOW (ref >59)

## 2021-02-02 NOTE — Telephone Encounter (Signed)
Copied from [place] [phone]. Topic: Referral - Status >> Feb 02, 2021 11:01 AM Scherrie Gerlach wrote: Reason for CRM: Judeen Hammans with [facility] said they cannot see the pt for this referral because they DO NOT accept adult medicaid.

## 2021-02-05 ENCOUNTER — Telehealth: Payer: Self-pay | Admitting: Family Medicine

## 2021-02-05 NOTE — Telephone Encounter (Signed)
Lvm to schedule appt.

## 2021-02-05 NOTE — Telephone Encounter (Signed)
Pt seen dr Wynetta Emery 02-01-2021 for hip and leg pain and the gabapentin is not helping with the pain. Pt also states she throw up everyday in the mornings and after she throws up she is better. Pt does not know if she mention that to dr Wynetta Emery. [facility] [address] phone number [phone]

## 2021-02-05 NOTE — Telephone Encounter (Signed)
PT had apt on 02/01/2021 would she need another apt?

## 2021-02-05 NOTE — Telephone Encounter (Signed)
Routing to provider  

## 2021-02-08 ENCOUNTER — Other Ambulatory Visit: Payer: Self-pay

## 2021-02-08 ENCOUNTER — Encounter: Payer: Self-pay | Admitting: Cardiology

## 2021-02-08 ENCOUNTER — Ambulatory Visit (INDEPENDENT_AMBULATORY_CARE_PROVIDER_SITE_OTHER): Payer: Self-pay | Admitting: Cardiology

## 2021-02-08 VITALS — BP 144/64 | HR 67 | Ht 64.0 in | Wt 156.0 lb

## 2021-02-08 DIAGNOSIS — I739 Peripheral vascular disease, unspecified: Secondary | ICD-10-CM

## 2021-02-08 DIAGNOSIS — F17200 Nicotine dependence, unspecified, uncomplicated: Secondary | ICD-10-CM

## 2021-02-08 DIAGNOSIS — E7800 Pure hypercholesterolemia, unspecified: Secondary | ICD-10-CM

## 2021-02-08 DIAGNOSIS — I10 Essential (primary) hypertension: Secondary | ICD-10-CM

## 2021-02-08 DIAGNOSIS — R072 Precordial pain: Secondary | ICD-10-CM

## 2021-02-08 DIAGNOSIS — R079 Chest pain, unspecified: Secondary | ICD-10-CM

## 2021-02-08 DIAGNOSIS — N184 Chronic kidney disease, stage 4 (severe): Secondary | ICD-10-CM

## 2021-02-08 MED ORDER — CILOSTAZOL 100 MG PO TABS: 100.0000 mg | ORAL_TABLET | Freq: Two times a day (BID) | ORAL | 3 refills | Status: AC

## 2021-02-08 NOTE — Progress Notes (Signed)
Cardiology Office Note:    Date:  02/08/2021   ID:  Brittany Jacobson, DOB 07/23/1961, MRN [id number]  PCP:  [name], DO   [address] [facility] Providers Cardiologist:  None     Referring MD: [name], DO   Chief Complaint  Patient presents with  . New Patient (Initial Visit)    Referred by PCP for chest pain. Patient c.o chest pain and leg pain. Meds reviewed verbally with patient.     History of Present Illness:    Brittany Jacobson is a 60 y.o. female with a hx of hypertension, hyperlipidemia, current smoker x40+ years, CKD stage IV who presents due to chest pain.  States having symptoms of left-sided chest pain over the past 5 months.  Symptoms are not associated with exertion.  Also endorses thigh and calf pain which is worse with exertion/walking, relieved with rest.  Recent blood work on 4/14 showed elevated cholesterol levels.  Her Lipitor dose was increased to 80 mg.  Also has a history of CKD, has not seen any nephrologist.  Endorses eating salty foods.  Past Medical History:  Diagnosis Date  . Agoraphobia   . Anxiety   . Hypercholesteremia   . Hypertension   . Panic attacks   . Panic attacks   . Reflux     History reviewed. No pertinent surgical history.  Current Medications: Current Meds  Medication Sig  . albuterol (VENTOLIN HFA) 108 (90 Base) MCG/ACT inhaler Inhale 2 puffs into the lungs every 6 (six) hours as needed for wheezing or shortness of breath.  Marland Kitchen amLODipine (NORVASC) 10 MG tablet Take 1 tablet (10 mg total) by mouth daily.  Marland Kitchen aspirin 81 MG EC tablet Take 1 tablet (81 mg total) by mouth daily. Swallow whole.  Marland Kitchen atorvastatin (LIPITOR) 80 MG tablet Take 1 tablet (80 mg total) by mouth daily. TAKE 1 TABLET BY MOUTH ONCE DAILY FOR CHOLESTEROL  . budesonide-formoterol (SYMBICORT) 160-4.5 MCG/ACT inhaler Inhale 2 puffs into the lungs 2 (two) times daily.  . cilostazol (PLETAL) 100 MG tablet Take 1 tablet (100 mg total) by mouth 2 (two) times daily.   Marland Kitchen ezetimibe (ZETIA) 10 MG tablet Take 1 tablet (10 mg total) by mouth daily.  Marland Kitchen gabapentin (NEURONTIN) 300 MG capsule Take 1 capsule (300 mg total) by mouth 3 (three) times daily.  . hydrOXYzine (VISTARIL) 25 MG capsule Take 1 capsule (25 mg total) by mouth 3 (three) times daily as needed.  Marland Kitchen PARoxetine (PAXIL) 40 MG tablet TAKE 1 TABLET BY MOUTH ONCE DAILY FOR STRESS.     Allergies:   Codeine   Social History   Socioeconomic History  . Marital status: Legally Separated    Spouse name: Not on file  . Number of children: Not on file  . Years of education: Not on file  . Highest education level: Not on file  Occupational History  . Not on file  Tobacco Use  . Smoking status: Current Every Day Smoker    Packs/day: 1.00    Types: Cigarettes  . Smokeless tobacco: Never Used  Vaping Use  . Vaping Use: Never used  Substance and Sexual Activity  . Alcohol use: Yes    Comment: occassional  . Drug use: No  . Sexual activity: Not Currently    Birth control/protection: None  Other Topics Concern  . Not on file  Social History Narrative  . Not on file   Social Determinants of Health   Financial Resource Strain: Not on  file  Food Insecurity: Not on file  Transportation Needs: Not on file  Physical Activity: Not on file  Stress: Not on file  Social Connections: Not on file     Family History: The patient's family history includes Cancer in her maternal grandfather, mother, and sister; Dementia in her paternal aunt; Heart attack in her father and paternal grandfather; Heart disease in her father and maternal grandmother; Hyperlipidemia in her father, paternal grandfather, and paternal grandmother; Hypertension in her father, paternal grandfather, and paternal grandmother; Mental illness in her father, sister, sister, and son.  ROS:   Please see the history of present illness.     All other systems reviewed and are negative.  EKGs/Labs/Other Studies Reviewed:    The following  studies were reviewed today:   EKG:  EKG is  ordered today.  The ekg ordered today demonstrates normal sinus rhythm, normal EKG  Recent Labs: 07/27/2020: Hemoglobin 12.4; Platelets 232; TSH 3.430 01/18/2021: ALT 10 02/01/2021: BUN 34; Creatinine, Ser 2.37; Potassium 3.7; Sodium 143  Recent Lipid Panel    Component Value Date/Time   CHOL 257 (H) 01/18/2021 1608   TRIG 365 (H) 01/18/2021 1608   HDL 31 (L) 01/18/2021 1608   CHOLHDL 10.0 (H) 06/01/2018 1628   LDLCALC 157 (H) 01/18/2021 1608     Risk Assessment/Calculations:      Physical Exam:    VS:  BP (!) 144/64 (BP Location: Left Arm, Patient Position: Sitting, Cuff Size: Normal)   Pulse 67   Ht '5\' 4"'$  (1.626 m)   Wt 156 lb (70.8 kg)   SpO2 98%   BMI 26.78 kg/m     Wt Readings from Last 3 Encounters:  02/08/21 156 lb (70.8 kg)  02/01/21 156 lb 6.4 oz (70.9 kg)  01/25/21 157 lb (71.2 kg)     GEN:  Well nourished, well developed in no acute distress HEENT: Normal NECK: No JVD; No carotid bruits LYMPHATICS: No lymphadenopathy CARDIAC: RRR, no murmurs, rubs, gallops RESPIRATORY:  Clear to auscultation without rales, wheezing or rhonchi  ABDOMEN: Soft, non-tender, non-distended MUSCULOSKELETAL:  No edema; No deformity  SKIN: Warm and dry NEUROLOGIC:  Alert and oriented x 3 PSYCHIATRIC:  Normal affect   ASSESSMENT:    1. Precordial pain   2. Claudication ([place])   3. Primary hypertension   4. Pure hypercholesterolemia   5. Smoking   6. CKD (chronic kidney disease) stage 4, GFR 15-29 ml/min (HCC)   7. Chest pain, unspecified type    PLAN:    In order of problems listed above:  1. Chest pain, risk factors hypertension, hyperlipidemia, current smoker.  Obtain echocardiogram to evaluate systolic and diastolic function, get Lexiscan Myoview to evaluate presence of ischemia. 2. Claudication, obtain peripheral arterial ultrasound, start cilostazol 100 mg twice daily.  Pending results, will plan referral to peripheral  vascular clinic. 3. Hypertension, BP elevated.  Low-salt diet advised.  If BP elevated at follow-up visit, consider starting hydralazine or ARB if okay with nephrology. 4. Hyperlipidemia, Lipitor increased to 80 mg daily recently.  Repeat fasting lipid profile in about 6 weeks. 5. Current smoker, cessation advised. 6. CKD stage IV, will refer to nephrology.  Follow-up after echo, Myoview, peripheral arterial ultrasound.   Shared Decision Making/Informed Consent The risks [chest pain, shortness of breath, cardiac arrhythmias, dizziness, blood pressure fluctuations, myocardial infarction, stroke/transient ischemic attack, nausea, vomiting, allergic reaction, radiation exposure, metallic taste sensation and life-threatening complications (estimated to be 1 in 10,000)], benefits (risk stratification, diagnosing coronary artery disease,  treatment guidance) and alternatives of a nuclear stress test were discussed in detail with Brittany Jacobson and she agrees to proceed.    Medication Adjustments/Labs and Tests Ordered: Current medicines are reviewed at length with the patient today.  Concerns regarding medicines are outlined above.  Orders Placed This Encounter  Procedures  . NM Myocar Multi W/Spect W/Wall Motion / EF  . Ambulatory referral to Nephrology  . EKG 12-Lead  . ECHOCARDIOGRAM COMPLETE  . VAS [place] LOWER EXTREMITY ARTERIAL DUPLEX   Meds ordered this encounter  Medications  . cilostazol (PLETAL) 100 MG tablet    Sig: Take 1 tablet (100 mg total) by mouth 2 (two) times daily.    Dispense:  60 tablet    Refill:  3    Patient Instructions  Medication Instructions:   Your physician has recommended you make the following change in your medication:   1.  START taking Cilostazol 100 MG twice a day.  *If you need a refill on your cardiac medications before your next appointment, please call your pharmacy*   Lab Work: None ordered   If you have labs (blood work) drawn today and your  tests are completely normal, you will receive your results only by: Marland Kitchen MyChart Message (if you have MyChart) OR . A paper copy in the mail If you have any lab test that is abnormal or we need to change your treatment, we will call you to review the results.   Testing/Procedures:  1.  Your physician has requested that you have an echocardiogram. Echocardiography is a painless test that uses sound waves to create images of your heart. It provides your doctor with information about the size and shape of your heart and how well your heart's chambers and valves are working. This procedure takes approximately one hour. There are no restrictions for this procedure.  2.  Your physician has requested that you have a lower extremity arterial duplex. This test is an ultrasound of the arteries in the legs or arms. It looks at arterial blood flow in the legs and arms. Allow one hour for Lower and Upper Arterial scans. There are no restrictions or special instructions  3.  [place]       Your caregiver has ordered a Stress Test with nuclear imaging. The purpose of this test is to evaluate the blood supply to your heart muscle. This procedure is referred to as a "Non-Invasive Stress Test." This is because other than having an IV started in your vein, nothing is inserted or "invades" your body. Cardiac stress tests are done to find areas of poor blood flow to the heart by determining the extent of coronary artery disease (CAD). Some patients exercise on a treadmill, which naturally increases the blood flow to your heart, while others who are  unable to walk on a treadmill due to physical limitations have a pharmacologic/chemical stress agent called Lexiscan . This medicine will mimic walking on a treadmill by temporarily increasing your coronary blood flow.      PLEASE REPORT TO [facility]ICAL MALL ENTRANCE   THE VOLUNTEERS AT THE FIRST DESK WILL DIRECT YOU WHERE TO GO     *Please note: these test may take  anywhere between 2-4 hours to complete       Date of Procedure:_____________________________________   Arrival Time for Procedure:______________________________    PLEASE NOTIFY THE OFFICE AT LEAST 24 HOURS IN ADVANCE IF YOU ARE UNABLE TO KEEP YOUR APPOINTMENT.  [place]  AT [facility] AT LEAST 24 HOURS IN ADVANCE IF YOU ARE UNABLE TO KEEP YOUR APPOINTMENT. [phone]         How to prepare for your Myoview test:    1. Do not eat or drink after midnight  2. No caffeine for 24 hours prior to test  3. No smoking 24 hours prior to test.  4. Unless instructed otherwise, Take your medication with a small sips of water.    5.         Ladies, please do not wear dresses. Skirts or pants are appropriate. Please wear a short sleeve shirt.  6. No perfume, cologne or lotion.  7. Wear comfortable walking shoes. No heels!       Follow-Up: At [facility], you and your health needs are our priority.  As part of our continuing mission to provide you with exceptional heart care, we have created designated Provider Care Teams.  These Care Teams include your primary Cardiologist (physician) and Advanced Practice Providers (APPs -  Physician Assistants and Nurse Practitioners) who all work together to provide you with the care you need, when you need it.  We recommend signing up for the patient portal called "MyChart".  Sign up information is provided on this After Visit Summary.  MyChart is used to connect with patients for Virtual Visits (Telemedicine).  Patients are able to view lab/test results, encounter notes, upcoming appointments, etc.  Non-urgent messages can be sent to your provider as well.   To learn more about what you can do with MyChart, go to [email or web address].    Your next appointment:   Follow up after testing   The format for your next appointment:   In Person  Provider:   Kate Sable, MD  ONLY   Other Instructions       Signed, Kate Sable, MD  02/08/2021 12:23 PM    [place]

## 2021-02-08 NOTE — Patient Instructions (Signed)
Medication Instructions:   Your physician has recommended you make the following change in your medication:   1.  START taking Cilostazol 100 MG twice a day.  *If you need a refill on your cardiac medications before your next appointment, please call your pharmacy*   Lab Work: None ordered   If you have labs (blood work) drawn today and your tests are completely normal, you will receive your results only by: Marland Kitchen MyChart Message (if you have MyChart) OR . A paper copy in the mail If you have any lab test that is abnormal or we need to change your treatment, we will call you to review the results.   Testing/Procedures:  1.  Your physician has requested that you have an echocardiogram. Echocardiography is a painless test that uses sound waves to create images of your heart. It provides your doctor with information about the size and shape of your heart and how well your heart's chambers and valves are working. This procedure takes approximately one hour. There are no restrictions for this procedure.  2.  Your physician has requested that you have a lower extremity arterial duplex. This test is an ultrasound of the arteries in the legs or arms. It looks at arterial blood flow in the legs and arms. Allow one hour for Lower and Upper Arterial scans. There are no restrictions or special instructions  3.  [place]       Your caregiver has ordered a Stress Test with nuclear imaging. The purpose of this test is to evaluate the blood supply to your heart muscle. This procedure is referred to as a "Non-Invasive Stress Test." This is because other than having an IV started in your vein, nothing is inserted or "invades" your body. Cardiac stress tests are done to find areas of poor blood flow to the heart by determining the extent of coronary artery disease (CAD). Some patients exercise on a treadmill, which naturally increases the blood flow to your heart, while others who are  unable to walk on a  treadmill due to physical limitations have a pharmacologic/chemical stress agent called Lexiscan . This medicine will mimic walking on a treadmill by temporarily increasing your coronary blood flow.      PLEASE REPORT TO [facility] MEDICAL MALL ENTRANCE   THE VOLUNTEERS AT THE FIRST DESK WILL DIRECT YOU WHERE TO GO     *Please note: these test may take anywhere between 2-4 hours to complete       Date of Procedure:_____________________________________   Arrival Time for Procedure:______________________________    PLEASE NOTIFY THE OFFICE AT LEAST 24 HOURS IN ADVANCE IF YOU ARE UNABLE TO KEEP YOUR APPOINTMENT.  [place] 24 HOURS IN ADVANCE IF YOU ARE UNABLE TO KEEP YOUR APPOINTMENT. [phone]         How to prepare for your Myoview test:    1. Do not eat or drink after midnight  2. No caffeine for 24 hours prior to test  3. No smoking 24 hours prior to test.  4. Unless instructed otherwise, Take your medication with a small sips of water.    5.         Ladies, please do not wear dresses. Skirts or pants are appropriate. Please wear a short sleeve shirt.  6. No perfume, cologne or lotion.  7. Wear comfortable walking shoes. No heels!       Follow-Up: At [facility], you and your health needs are  our priority.  As part of our continuing mission to provide you with exceptional heart care, we have created designated Provider Care Teams.  These Care Teams include your primary Cardiologist (physician) and Advanced Practice Providers (APPs -  Physician Assistants and Nurse Practitioners) who all work together to provide you with the care you need, when you need it.  We recommend signing up for the patient portal called "MyChart".  Sign up information is provided on this After Visit Summary.  MyChart is used to connect with patients for Virtual Visits (Telemedicine).  Patients are able to view lab/test results, encounter notes,  upcoming appointments, etc.  Non-urgent messages can be sent to your provider as well.   To learn more about what you can do with MyChart, go to [email or web address].    Your next appointment:   Follow up after testing   The format for your next appointment:   In Person  Provider:   Kate Sable, MD  ONLY   Other Instructions

## 2021-02-09 ENCOUNTER — Ambulatory Visit: Payer: Self-pay | Admitting: Family Medicine

## 2021-02-13 ENCOUNTER — Ambulatory Visit: Admission: RE | Admit: 2021-02-13 | Payer: Self-pay | Source: Ambulatory Visit

## 2021-02-26 ENCOUNTER — Telehealth: Payer: Self-pay | Admitting: Family Medicine

## 2021-02-26 ENCOUNTER — Telehealth: Payer: Self-pay

## 2021-02-26 NOTE — Telephone Encounter (Unsigned)
Copied from [place] [id number]. Topic: Quick Communication - Rx Refill/Question >> Feb 26, 2021  4:26 PM Yvette Rack wrote: Medication: clonazePAM (KLONOPIN) 0.5 MG tablet  Has the patient contacted their pharmacy? no  Preferred Pharmacy (with phone number or street name): [place], [place]  Phone: [phone]  Fax: [phone]  Agent: Please be advised that RX refills may take up to 3 business days. We ask that you follow-up with your pharmacy.

## 2021-02-26 NOTE — Telephone Encounter (Signed)
Over due for follow up

## 2021-02-26 NOTE — Telephone Encounter (Signed)
Copied from [place] [id number]. Topic: General - Other >> Feb 26, 2021  4:23 PM Yvette Rack wrote: Reason for CRM: Pt stated she is not working due to her legs. Pt stated she would like to make Dr. Wynetta Emery aware that she applied for disability in order for Medicaid to assist her with her bills.

## 2021-02-27 NOTE — Telephone Encounter (Signed)
Call pt to schedule appt per Dr Wynetta Emery overdue for f/u no answer left vm

## 2021-02-27 NOTE — Telephone Encounter (Signed)
Attempted to call patient regarding request:  Medication: clonazePAM (KLONOPIN) 0.5 MG tablet Medication is not current on medication list- discontinued 3/21. Patient may be required to have appointment to discuss restarting medication.  Left message to call office.

## 2021-03-06 NOTE — Telephone Encounter (Signed)
Called pt to schedule no answer left vm  

## 2021-03-12 ENCOUNTER — Encounter: Payer: Self-pay | Admitting: Family Medicine

## 2021-03-12 NOTE — Telephone Encounter (Signed)
Called to schedule no answer left vm sent letter via mychart and mailed

## 2021-03-12 NOTE — Telephone Encounter (Signed)
Lvm to schedule apt/  

## 2021-03-13 ENCOUNTER — Encounter: Payer: Self-pay | Admitting: Family Medicine

## 2021-03-13 NOTE — Telephone Encounter (Signed)
Lvm to schedule apt. Called X3

## 2021-03-13 NOTE — Telephone Encounter (Signed)
Sent letter

## 2021-03-14 ENCOUNTER — Other Ambulatory Visit (HOSPITAL_COMMUNITY): Payer: Self-pay | Admitting: Nephrology

## 2021-03-14 ENCOUNTER — Other Ambulatory Visit: Payer: Self-pay | Admitting: Nephrology

## 2021-03-14 DIAGNOSIS — N184 Chronic kidney disease, stage 4 (severe): Secondary | ICD-10-CM

## 2021-03-14 DIAGNOSIS — R8290 Unspecified abnormal findings in urine: Secondary | ICD-10-CM

## 2021-03-15 ENCOUNTER — Other Ambulatory Visit: Payer: Self-pay | Admitting: Cardiology

## 2021-03-15 ENCOUNTER — Other Ambulatory Visit: Payer: Self-pay

## 2021-03-15 DIAGNOSIS — I739 Peripheral vascular disease, unspecified: Secondary | ICD-10-CM

## 2021-03-16 ENCOUNTER — Ambulatory Visit (INDEPENDENT_AMBULATORY_CARE_PROVIDER_SITE_OTHER): Payer: Self-pay

## 2021-03-16 ENCOUNTER — Other Ambulatory Visit: Payer: Self-pay

## 2021-03-16 DIAGNOSIS — I739 Peripheral vascular disease, unspecified: Secondary | ICD-10-CM

## 2021-03-16 DIAGNOSIS — R072 Precordial pain: Secondary | ICD-10-CM

## 2021-03-16 DIAGNOSIS — R079 Chest pain, unspecified: Secondary | ICD-10-CM

## 2021-03-16 LAB — ECHOCARDIOGRAM COMPLETE
AR max vel: 2.58 cm2
AV Area VTI: 2.22 cm2
AV Area mean vel: 2.47 cm2
AV Mean grad: 4 mmHg
AV Peak grad: 7.1 mmHg
Ao pk vel: 1.33 m/s
Area-P 1/2: 3.02 cm2
Calc EF: 56.1 %
S' Lateral: 3.4 cm
Single Plane A2C EF: 56.3 %
Single Plane A4C EF: 55.4 %

## 2021-03-19 ENCOUNTER — Ambulatory Visit (INDEPENDENT_AMBULATORY_CARE_PROVIDER_SITE_OTHER): Payer: Self-pay | Admitting: Cardiology

## 2021-03-19 ENCOUNTER — Other Ambulatory Visit: Payer: Self-pay

## 2021-03-19 ENCOUNTER — Encounter: Payer: Self-pay | Admitting: Cardiology

## 2021-03-19 VITALS — BP 130/70 | HR 92 | Ht 63.0 in | Wt 153.0 lb

## 2021-03-19 DIAGNOSIS — F17200 Nicotine dependence, unspecified, uncomplicated: Secondary | ICD-10-CM

## 2021-03-19 DIAGNOSIS — R072 Precordial pain: Secondary | ICD-10-CM

## 2021-03-19 DIAGNOSIS — I739 Peripheral vascular disease, unspecified: Secondary | ICD-10-CM

## 2021-03-19 DIAGNOSIS — I10 Essential (primary) hypertension: Secondary | ICD-10-CM

## 2021-03-19 DIAGNOSIS — E7800 Pure hypercholesterolemia, unspecified: Secondary | ICD-10-CM

## 2021-03-19 NOTE — Progress Notes (Signed)
Cardiology Office Note:    Date:  03/19/2021   ID:  Brittany Jacobson, DOB 12/16/1960, MRN [id number]  PCP:  [name], DO   [facility] Providers Cardiologist:  None     Referring MD: [name], DO   Chief Complaint  Patient presents with   Other    Follow up post testing. Meds reviewed verbally with patient.     History of Present Illness:    Brittany Jacobson is a 60 y.o. female with a hx of hypertension, hyperlipidemia, current smoker x40+ years, CKD stage IV who presents follow-up.    She was last seen due to chest pain and leg pain consistent with claudication.  Peripheral arterial ultrasound was ordered, echo and Myoview was ordered.  She was able to obtain echo and arterial ultrasound, did not obtain stress test.  Still has occasional chest discomfort/tightness.  Has hip and thigh pain with walking.  Endorsed having nausea/vomiting without warning over the past month.    Past Medical History:  Diagnosis Date   Agoraphobia    Anxiety    Hypercholesteremia    Hypertension    Panic attacks    Panic attacks    Reflux     History reviewed. No pertinent surgical history.  Current Medications: Current Meds  Medication Sig   albuterol (VENTOLIN HFA) 108 (90 Base) MCG/ACT inhaler Inhale 2 puffs into the lungs every 6 (six) hours as needed for wheezing or shortness of breath.   amLODipine (NORVASC) 10 MG tablet Take 1 tablet (10 mg total) by mouth daily.   aspirin 81 MG EC tablet Take 1 tablet (81 mg total) by mouth daily. Swallow whole.   atorvastatin (LIPITOR) 80 MG tablet Take 1 tablet (80 mg total) by mouth daily. TAKE 1 TABLET BY MOUTH ONCE DAILY FOR CHOLESTEROL   budesonide-formoterol (SYMBICORT) 160-4.5 MCG/ACT inhaler Inhale 2 puffs into the lungs 2 (two) times daily.   cilostazol (PLETAL) 100 MG tablet Take 1 tablet (100 mg total) by mouth 2 (two) times daily.   ezetimibe (ZETIA) 10 MG tablet Take 1 tablet (10 mg total) by mouth daily.   gabapentin  (NEURONTIN) 300 MG capsule Take 1 capsule (300 mg total) by mouth 3 (three) times daily.   hydrOXYzine (VISTARIL) 25 MG capsule Take 1 capsule (25 mg total) by mouth 3 (three) times daily as needed.   PARoxetine (PAXIL) 40 MG tablet TAKE 1 TABLET BY MOUTH ONCE DAILY FOR STRESS.     Allergies:   Codeine   Social History   Socioeconomic History   Marital status: Legally Separated    Spouse name: Not on file   Number of children: Not on file   Years of education: Not on file   Highest education level: Not on file  Occupational History   Not on file  Tobacco Use   Smoking status: Every Day    Packs/day: 1.00    Pack years: 0.00    Types: Cigarettes   Smokeless tobacco: Never  Vaping Use   Vaping Use: Never used  Substance and Sexual Activity   Alcohol use: Yes    Comment: occassional   Drug use: No   Sexual activity: Not Currently    Birth control/protection: None  Other Topics Concern   Not on file  Social History Narrative   Not on file   Social Determinants of Health   Financial Resource Strain: Not on file  Food Insecurity: Not on file  Transportation Needs: Not on file  Physical Activity: Not on file  Stress: Not on file  Social Connections: Not on file     Family History: The patient's family history includes Cancer in her maternal grandfather, mother, and sister; Dementia in her paternal aunt; Heart attack in her father and paternal grandfather; Heart disease in her father and maternal grandmother; Hyperlipidemia in her father, paternal grandfather, and paternal grandmother; Hypertension in her father, paternal grandfather, and paternal grandmother; Mental illness in her father, sister, sister, and son.  ROS:   Please see the history of present illness.     All other systems reviewed and are negative.  EKGs/Labs/Other Studies Reviewed:    The following studies were reviewed today:   EKG:  EKG not ordered today.    Recent Labs: 07/27/2020: Hemoglobin  12.4; Platelets 232; TSH 3.430 01/18/2021: ALT 10 02/01/2021: BUN 34; Creatinine, Ser 2.37; Potassium 3.7; Sodium 143  Recent Lipid Panel    Component Value Date/Time   CHOL 257 (H) 01/18/2021 1608   TRIG 365 (H) 01/18/2021 1608   HDL 31 (L) 01/18/2021 1608   CHOLHDL 10.0 (H) 06/01/2018 1628   LDLCALC 157 (H) 01/18/2021 1608     Risk Assessment/Calculations:      Physical Exam:    VS:  BP 130/70 (BP Location: Left Arm, Patient Position: Sitting, Cuff Size: Normal)   Pulse 92   Ht '5\' 3"'$  (1.6 m)   Wt 153 lb (69.4 kg)   SpO2 98%   BMI 27.10 kg/m     Wt Readings from Last 3 Encounters:  03/19/21 153 lb (69.4 kg)  02/08/21 156 lb (70.8 kg)  02/01/21 156 lb 6.4 oz (70.9 kg)     GEN:  Well nourished, well developed in no acute distress HEENT: Normal NECK: No JVD; No carotid bruits LYMPHATICS: No lymphadenopathy CARDIAC: RRR, no murmurs, rubs, gallops RESPIRATORY:  Clear to auscultation without rales, wheezing or rhonchi  ABDOMEN: Soft, non-tender, non-distended MUSCULOSKELETAL:  No edema; No deformity  SKIN: Warm and dry NEUROLOGIC:  Alert and oriented x 3 PSYCHIATRIC:  Normal affect   ASSESSMENT:    1. Precordial pain   2. Claudication ([place])   3. Primary hypertension   4. Pure hypercholesterolemia   5. Smoking     PLAN:    In order of problems listed above:  Chest pain, risk factors hypertension, hyperlipidemia, current smoker.  Echo showed normal systolic function, impaired relaxation, EF 55 to 60%.  Lexiscan Myoview pending.  Patient advised to obtain stress test. Claudication,  peripheral arterial ultrasound showed abnormal right ABI 0.83, possible right proximal popliteal artery stenosis.  Refer to peripheral vascular clinic with Dr. Fletcher Anon.  Continue cilostazol, aspirin, Lipitor. Hypertension, BP controlled.  Continue amlodipine. Hyperlipidemia, continue Lipitor, Zetia.  Obtain repeat fasting lipid profile. Current smoker, cessation advised.    Advised to  follow-up with PCP regarding anxiety, panic attacks, vomiting.  Follow-up in 3 months.  We will call patient with results of Lexiscan.  If abnormal, will schedule earlier appointment.      Medication Adjustments/Labs and Tests Ordered: Current medicines are reviewed at length with the patient today.  Concerns regarding medicines are outlined above.  Orders Placed This Encounter  Procedures   Lipid panel   Ambulatory referral to Cardiology    No orders of the defined types were placed in this encounter.   Patient Instructions  Medication Instructions:  No changes *If you need a refill on your cardiac medications before your next appointment, please call your pharmacy*   Lab Work:  Your physician recommends that you return for a FASTING lipid profile:  IN 3 MONTHS prior to your follow up with Dr. Garen Lah.  - You will need to be fasting. Please do not have anything to eat or drink after midnight the morning you have the lab work. You may only have water or black coffee with no cream or sugar.  - Please go to the [facility]. You will check in at the front desk to the right as you walk into the atrium. Valet Parking is offered if needed. - No appointment needed. You may go any day between 7 am and 6 pm.    Testing/Procedures:  [place]        Your caregiver has ordered a Stress Test with nuclear imaging. The purpose of this test is to evaluate the blood supply to your heart muscle. This procedure is referred to as a "Non-Invasive Stress Test." This is because other than having an IV started in your vein, nothing is inserted or "invades" your body. Cardiac stress tests are done to find areas of poor blood flow to the heart by determining the extent of coronary artery disease (CAD). Some patients exercise on a treadmill, which naturally increases the blood flow to your heart, while others who are  unable to walk on a treadmill due to physical limitations have a  pharmacologic/chemical stress agent called Lexiscan . This medicine will mimic walking on a treadmill by temporarily increasing your coronary blood flow.      PLEASE REPORT TO [facility] MEDICAL MALL ENTRANCE   THE VOLUNTEERS AT THE FIRST DESK WILL DIRECT YOU WHERE TO GO       *Please note: these test may take anywhere between 2-4 hours to complete        Date of Procedure:_____________________________________   Arrival Time for Procedure:______________________________     PLEASE NOTIFY THE OFFICE AT LEAST 24 HOURS IN ADVANCE IF YOU ARE UNABLE TO KEEP YOUR APPOINTMENT.  [place] 24 HOURS IN ADVANCE IF YOU ARE UNABLE TO KEEP YOUR APPOINTMENT. [phone]            How to prepare for your Myoview test:     1.         Do not eat or drink after midnight 2.         No caffeine for 24 hours prior to test 3.         No smoking 24 hours prior to test. 4.         Unless instructed otherwise, Take your medication with a small sips of water.         5.         Ladies, please do not wear dresses. Skirts or pants are appropriate. Please wear a short sleeve shirt. 6.         No perfume, cologne or lotion. 7.         Wear comfortable walking shoes. No heels!      Follow-Up: At [facility], you and your health needs are our priority.  As part of our continuing mission to provide you with exceptional heart care, we have created designated Provider Care Teams.  These Care Teams include your primary Cardiologist (physician) and Advanced Practice Providers (APPs -  Physician Assistants and Nurse Practitioners) who all work together to provide you with the care you need, when you need it.  We recommend signing up for the patient portal called "  MyChart".  Sign up information is provided on this After Visit Summary.  MyChart is used to connect with patients for Virtual Visits (Telemedicine).  Patients are able to view lab/test results, encounter  notes, upcoming appointments, etc.  Non-urgent messages can be sent to your provider as well.   To learn more about what you can do with MyChart, go to [email or web address].    Your next appointment:   3 month(s)  The format for your next appointment:   In Person  Provider:   Kate Sable, MD   Other Instructions   Signed, Kate Sable, MD  03/19/2021 12:22 PM    [place]

## 2021-03-19 NOTE — Patient Instructions (Addendum)
Medication Instructions:  No changes *If you need a refill on your cardiac medications before your next appointment, please call your pharmacy*   Lab Work:  Your physician recommends that you return for a FASTING lipid profile:  IN 3 MONTHS prior to your follow up with Dr. Garen Lah.  - You will need to be fasting. Please do not have anything to eat or drink after midnight the morning you have the lab work. You may only have water or black coffee with no cream or sugar.  - Please go to the [facility]. You will check in at the front desk to the right as you walk into the atrium. Valet Parking is offered if needed. - No appointment needed. You may go any day between 7 am and 6 pm.    Testing/Procedures:  [place]        Your caregiver has ordered a Stress Test with nuclear imaging. The purpose of this test is to evaluate the blood supply to your heart muscle. This procedure is referred to as a "Non-Invasive Stress Test." This is because other than having an IV started in your vein, nothing is inserted or "invades" your body. Cardiac stress tests are done to find areas of poor blood flow to the heart by determining the extent of coronary artery disease (CAD). Some patients exercise on a treadmill, which naturally increases the blood flow to your heart, while others who are  unable to walk on a treadmill due to physical limitations have a pharmacologic/chemical stress agent called Lexiscan . This medicine will mimic walking on a treadmill by temporarily increasing your coronary blood flow.      PLEASE REPORT TO [facility] MEDICAL MALL ENTRANCE   THE VOLUNTEERS AT THE FIRST DESK WILL DIRECT YOU WHERE TO GO       *Please note: these test may take anywhere between 2-4 hours to complete        Date of Procedure:_____________________________________   Arrival Time for Procedure:______________________________     PLEASE NOTIFY THE OFFICE AT LEAST 24 HOURS IN ADVANCE IF YOU ARE UNABLE  TO KEEP YOUR APPOINTMENT.  [place] 24 HOURS IN ADVANCE IF YOU ARE UNABLE TO KEEP YOUR APPOINTMENT. [phone]            How to prepare for your Myoview test:     1.         Do not eat or drink after midnight 2.         No caffeine for 24 hours prior to test 3.         No smoking 24 hours prior to test. 4.         Unless instructed otherwise, Take your medication with a small sips of water.         5.         Ladies, please do not wear dresses. Skirts or pants are appropriate. Please wear a short sleeve shirt. 6.         No perfume, cologne or lotion. 7.         Wear comfortable walking shoes. No heels!      Follow-Up: At [facility], you and your health needs are our priority.  As part of our continuing mission to provide you with exceptional heart care, we have created designated Provider Care Teams.  These Care Teams include your primary Cardiologist (physician) and Advanced Practice Providers (APPs -  Physician Assistants and Nurse  Practitioners) who all work together to provide you with the care you need, when you need it.  We recommend signing up for the patient portal called "MyChart".  Sign up information is provided on this After Visit Summary.  MyChart is used to connect with patients for Virtual Visits (Telemedicine).  Patients are able to view lab/test results, encounter notes, upcoming appointments, etc.  Non-urgent messages can be sent to your provider as well.   To learn more about what you can do with MyChart, go to [email or web address].    Your next appointment:   3 month(s)  The format for your next appointment:   In Person  Provider:   Kate Sable, MD   Other Instructions

## 2021-04-02 ENCOUNTER — Ambulatory Visit
Admission: RE | Admit: 2021-04-02 | Discharge: 2021-04-02 | Disposition: A | Discharge status: Home / Self Care | Payer: Self-pay | Source: Ambulatory Visit | Attending: Cardiology | Admitting: Cardiology

## 2021-04-02 ENCOUNTER — Other Ambulatory Visit: Payer: Self-pay

## 2021-04-02 DIAGNOSIS — R079 Chest pain, unspecified: Secondary | ICD-10-CM | POA: Insufficient documentation

## 2021-04-05 ENCOUNTER — Ambulatory Visit
Admission: RE | Admit: 2021-04-05 | Discharge: 2021-04-05 | Disposition: A | Discharge status: Home / Self Care | Payer: Medicaid Other | Source: Ambulatory Visit | Attending: Cardiology | Admitting: Cardiology

## 2021-04-05 ENCOUNTER — Other Ambulatory Visit: Payer: Self-pay

## 2021-04-05 DIAGNOSIS — R079 Chest pain, unspecified: Secondary | ICD-10-CM | POA: Insufficient documentation

## 2021-04-05 LAB — NM MYOCAR MULTI W/SPECT W/WALL MOTION / EF
Estimated workload: 1 METS
Exercise duration (min): 0 min
Exercise duration (sec): 0 s
LV dias vol: 92 mL (ref 46–106)
LV sys vol: 32 mL
MPHR: 161 {beats}/min
Peak HR: 99 {beats}/min
Percent HR: 61 %
Rest HR: 71 {beats}/min
SDS: 7
SRS: 7
SSS: 8
TID: 0.88

## 2021-04-05 MED ORDER — REGADENOSON 0.4 MG/5ML IV SOLN
0.4000 mg | Freq: Once | INTRAVENOUS | Status: AC
  Administered 2021-04-05: 0.4 mg via INTRAVENOUS

## 2021-04-05 MED ORDER — TECHNETIUM TC 99M TETROFOSMIN IV KIT
30.0000 | PACK | Freq: Once | INTRAVENOUS | Status: AC | PRN
  Administered 2021-04-05: 30.8 via INTRAVENOUS

## 2021-04-05 MED ORDER — TECHNETIUM TC 99M TETROFOSMIN IV KIT
10.1500 | PACK | Freq: Once | INTRAVENOUS | Status: AC | PRN
  Administered 2021-04-05: 10.15 via INTRAVENOUS

## 2021-04-12 ENCOUNTER — Ambulatory Visit: Payer: Self-pay | Admitting: Cardiovascular Disease

## 2021-04-12 NOTE — Progress Notes (Deleted)
Cardiology Office Note   Date:  04/12/2021   ID:  Brittany Jacobson, DOB Oct 22, 1960, MRN [id number]  PCP:  [name], DO  Cardiologist:  Dr. Garen Lah  No chief complaint on file.     History of Present Illness: Brittany Jacobson is a 60 y.o. female who was referred by Dr. Garen Lah for evaluation management of peripheral arterial disease. She has known history of essential hypertension, hyperlipidemia, tobacco use and stage IV chronic kidney disease.   Past Medical History:  Diagnosis Date   Agoraphobia    Anxiety    Hypercholesteremia    Hypertension    Panic attacks    Panic attacks    Reflux     No past surgical history on file.   Current Outpatient Medications  Medication Sig Dispense Refill   albuterol (VENTOLIN HFA) 108 (90 Base) MCG/ACT inhaler Inhale 2 puffs into the lungs every 6 (six) hours as needed for wheezing or shortness of breath. 54 g 1   amLODipine (NORVASC) 10 MG tablet Take 1 tablet (10 mg total) by mouth daily. 30 tablet 3   aspirin 81 MG EC tablet Take 1 tablet (81 mg total) by mouth daily. Swallow whole. 90 tablet 4   atorvastatin (LIPITOR) 80 MG tablet Take 1 tablet (80 mg total) by mouth daily. TAKE 1 TABLET BY MOUTH ONCE DAILY FOR CHOLESTEROL 90 tablet 1   budesonide-formoterol (SYMBICORT) 160-4.5 MCG/ACT inhaler Inhale 2 puffs into the lungs 2 (two) times daily. 3 each 3   cilostazol (PLETAL) 100 MG tablet Take 1 tablet (100 mg total) by mouth 2 (two) times daily. 60 tablet 3   ezetimibe (ZETIA) 10 MG tablet Take 1 tablet (10 mg total) by mouth daily. 30 tablet 3   gabapentin (NEURONTIN) 300 MG capsule Take 1 capsule (300 mg total) by mouth 3 (three) times daily. 90 capsule 3   hydrOXYzine (VISTARIL) 25 MG capsule Take 1 capsule (25 mg total) by mouth 3 (three) times daily as needed. 270 capsule 1   PARoxetine (PAXIL) 40 MG tablet TAKE 1 TABLET BY MOUTH ONCE DAILY FOR STRESS. 90 tablet 1   No current facility-administered medications  for this visit.    Allergies:   Codeine    Social History:  The patient  reports that she has been smoking cigarettes. She has been smoking an average of 1.00 packs per day. She has never used smokeless tobacco. She reports current alcohol use. She reports that she does not use drugs.   Family History:  The patient's ***family history includes Cancer in her maternal grandfather, mother, and sister; Dementia in her paternal aunt; Heart attack in her father and paternal grandfather; Heart disease in her father and maternal grandmother; Hyperlipidemia in her father, paternal grandfather, and paternal grandmother; Hypertension in her father, paternal grandfather, and paternal grandmother; Mental illness in her father, sister, sister, and son.    ROS:  Please see the history of present illness.   Otherwise, review of systems are positive for {NONE DEFAULTED:18576}.   All other systems are reviewed and negative.    PHYSICAL EXAM: VS:  There were no vitals taken for this visit. , BMI There is no height or weight on file to calculate BMI. GEN: Well nourished, well developed, in no acute distress  HEENT: normal  Neck: no JVD, carotid bruits, or masses Cardiac: ***RRR; no murmurs, rubs, or gallops,no edema  Respiratory:  clear to auscultation bilaterally, normal work of breathing GI: soft, nontender, nondistended, +  BS MS: no deformity or atrophy  Skin: warm and dry, no rash Neuro:  Strength and sensation are intact Psych: euthymic mood, full affect   EKG:  EKG {ACTION; IS/IS [id number] ordered today. The ekg ordered today demonstrates ***   Recent Labs: 07/27/2020: Hemoglobin 12.4; Platelets 232; TSH 3.430 01/18/2021: ALT 10 02/01/2021: BUN 34; Creatinine, Ser 2.37; Potassium 3.7; Sodium 143    Lipid Panel    Component Value Date/Time   CHOL 257 (H) 01/18/2021 1608   TRIG 365 (H) 01/18/2021 1608   HDL 31 (L) 01/18/2021 1608   CHOLHDL 10.0 (H) 06/01/2018 1628   LDLCALC 157 (H)  01/18/2021 1608      Wt Readings from Last 3 Encounters:  03/19/21 153 lb (69.4 kg)  02/08/21 156 lb (70.8 kg)  02/01/21 156 lb 6.4 oz (70.9 kg)      Other studies Reviewed: Additional studies/ records that were reviewed today include: ***. Review of the above records demonstrates: ***  No flowsheet data found.    ASSESSMENT AND PLAN:  1.  ***    Disposition:   FU with *** in {gen number [id number] {Days to years:10300}  Signed,  Kathlyn Sacramento, MD  04/12/2021 1:25 PM    [facility]

## 2021-04-13 ENCOUNTER — Encounter: Payer: Self-pay | Admitting: Cardiovascular Disease

## 2021-04-13 ENCOUNTER — Ambulatory Visit: Payer: Self-pay | Attending: Nephrology

## 2021-05-02 ENCOUNTER — Encounter: Payer: Self-pay | Admitting: Cardiology

## 2021-06-19 ENCOUNTER — Ambulatory Visit: Payer: Medicaid Other | Admitting: Cardiology

## 2021-06-26 ENCOUNTER — Ambulatory Visit: Payer: Medicaid Other | Attending: Nephrology

## 2021-07-01 ENCOUNTER — Telehealth: Payer: Self-pay | Admitting: Family Medicine

## 2021-07-02 NOTE — Telephone Encounter (Signed)
Pt called stating that she is out of this medication. She states that she is needing to have this refilled asap. She states that she is moving to [place] and is requesting to know if PCP can continue to prescribe medication until she can get PCP. Please advise.

## 2021-07-02 NOTE — Telephone Encounter (Signed)
Patient is overdue for follow up, please call to schedule.   Routing to provider for refill as well.

## 2021-07-02 NOTE — Telephone Encounter (Signed)
Requested medication (s) are due for refill today: yes  Requested medication (s) are on the active medication list: yes  Last refill: 01/22/21 #30  3 refills  Future visit scheduled no  Notes to clinic: Pt was to have follow up after last appt. Multiple no shows, cancellations. Please review. Thank you.  Requested Prescriptions  Pending Prescriptions Disp Refills   ezetimibe (ZETIA) 10 MG tablet [Pharmacy Med Name: Ezetimibe 10 MG Oral Tablet] 30 tablet 0    Sig: Take 1 tablet by mouth once daily     Cardiovascular:  Antilipid - Sterol Transport Inhibitors Failed - 07/02/2021 11:05 AM      Failed - Total Cholesterol in normal range and within 360 days    Cholesterol, Total  Date Value Ref Range Status  01/18/2021 257 (H) 100 - 199 mg/dL Final          Failed - LDL in normal range and within 360 days    LDL Chol Calc (NIH)  Date Value Ref Range Status  01/18/2021 157 (H) 0 - 99 mg/dL Final          Failed - HDL in normal range and within 360 days    HDL  Date Value Ref Range Status  01/18/2021 31 (L) >39 mg/dL Final          Failed - Triglycerides in normal range and within 360 days    Triglycerides  Date Value Ref Range Status  01/18/2021 365 (H) 0 - 149 mg/dL Final          Passed - Valid encounter within last 12 months    Recent Outpatient Visits           5 months ago Radicular leg pain   [place], Megan P, DO   5 months ago Radicular leg pain   [place], [place], DO   11 months ago Primary hypertension   [place], [place], DO   1 year ago Cough   [place], Megan P, DO   1 year ago Upper respiratory tract infection, unspecified type   [facility], Megan P, DO

## 2021-07-03 NOTE — Telephone Encounter (Signed)
Lmom for patient to schedule appointment for refill request.

## 2021-07-26 ENCOUNTER — Other Ambulatory Visit: Payer: Self-pay | Admitting: Family Medicine

## 2021-07-26 DIAGNOSIS — F410 Panic disorder [episodic paroxysmal anxiety] without agoraphobia: Secondary | ICD-10-CM

## 2021-07-26 MED ORDER — AMLODIPINE BESYLATE 10 MG PO TABS: 10.0000 mg | ORAL_TABLET | Freq: Every day | ORAL | 0 refills | Status: DC

## 2021-07-26 MED ORDER — PAROXETINE HCL 40 MG PO TABS: ORAL_TABLET | ORAL | 0 refills | Status: DC

## 2021-07-26 MED ORDER — EZETIMIBE 10 MG PO TABS: 10.0000 mg | ORAL_TABLET | Freq: Every day | ORAL | 0 refills | Status: DC

## 2021-07-26 MED ORDER — ATORVASTATIN CALCIUM 80 MG PO TABS: 80.0000 mg | ORAL_TABLET | Freq: Every day | ORAL | 0 refills | Status: DC

## 2021-07-26 NOTE — Telephone Encounter (Signed)
Medication Refill - Medication:  ezetimibe (ZETIA) 10 MG tablet amLODipine (NORVASC) 10 MG tablet atorvastatin (LIPITOR) 80 MG tablet PARoxetine (PAXIL) 40 MG tablet Has the patient contacted their pharmacy? Yes.   However, pt is in [place] caring for her ill father.  Advised pt she needs appt, and she did make a virtual, first available Monday 10/24.Marland Kitchen Pt asking if we can send prescriptions today because she is out of her medication, and not feeling so well. (Preferred Pharmacy (with phone number or street name): [id number] - [place] Has the patient been seen for an appointment in the last year OR does the patient have an upcoming appointment? Yes.    Agent: Please be advised that RX refills may take up to 3 business days. We ask that you follow-up with your pharmacy.

## 2021-07-26 NOTE — Telephone Encounter (Signed)
Requested Prescriptions  Pending Prescriptions Disp Refills  . amLODipine (NORVASC) 10 MG tablet 90 tablet 0    Sig: Take 1 tablet (10 mg total) by mouth daily.     Cardiovascular:  Calcium Channel Blockers Passed - 07/26/2021  6:33 PM      Passed - Last BP in normal range    BP Readings from Last 1 Encounters:  03/19/21 130/70         Passed - Valid encounter within last 6 months    Recent Outpatient Visits          5 months ago Radicular leg pain   [place], Megan P, DO   6 months ago Radicular leg pain   [place], [place], DO   12 months ago Primary hypertension   [place], [place], DO   1 year ago Cough   [place], Megan P, DO   1 year ago Upper respiratory tract infection, unspecified type   [facility] [place], Megan P, DO      Future Appointments            In 4 days [place], Barb Merino, DO [facility]           . ezetimibe (ZETIA) 10 MG tablet 90 tablet 0    Sig: Take 1 tablet (10 mg total) by mouth daily.     Cardiovascular:  Antilipid - Sterol Transport Inhibitors Failed - 07/26/2021  6:33 PM      Failed - Total Cholesterol in normal range and within 360 days    Cholesterol, Total  Date Value Ref Range Status  01/18/2021 257 (H) 100 - 199 mg/dL Final         Failed - LDL in normal range and within 360 days    LDL Chol Calc (NIH)  Date Value Ref Range Status  01/18/2021 157 (H) 0 - 99 mg/dL Final         Failed - HDL in normal range and within 360 days    HDL  Date Value Ref Range Status  01/18/2021 31 (L) >39 mg/dL Final         Failed - Triglycerides in normal range and within 360 days    Triglycerides  Date Value Ref Range Status  01/18/2021 365 (H) 0 - 149 mg/dL Final         Passed - Valid encounter within last 12 months    Recent Outpatient Visits          5 months ago Radicular leg pain   [place], Megan P, DO   6 months ago Radicular leg pain   [facility] [place], [place], DO   12 months ago Primary hypertension   [place], [place], DO   1 year ago Cough   [place], Megan P, DO   1 year ago Upper respiratory tract infection, unspecified type   [place], Barb Merino, DO      Future Appointments            In 4 days Wynetta Emery, Barb Merino, DO [employer], [facility]           . PARoxetine (PAXIL) 40 MG tablet 90 tablet 0    Sig: TAKE 1 TABLET BY MOUTH ONCE DAILY FOR STRESS.     Psychiatry:  Antidepressants - SSRI Passed - 07/26/2021  6:33 PM  Passed - Valid encounter within last 6 months    Recent Outpatient Visits          5 months ago Radicular leg pain   [place], Megan P, DO   6 months ago Radicular leg pain   [place], [place], DO   12 months ago Primary hypertension   [place], [place], DO   1 year ago Cough   [place], Megan P, DO   1 year ago Upper respiratory tract infection, unspecified type   [place], Barb Merino, DO      Future Appointments            In 4 days Wynetta Emery, Barb Merino, DO [employer], [place]           . atorvastatin (LIPITOR) 80 MG tablet 90 tablet 0    Sig: Take 1 tablet (80 mg total) by mouth daily. TAKE 1 TABLET BY MOUTH ONCE DAILY FOR CHOLESTEROL     Cardiovascular:  Antilipid - Statins Failed - 07/26/2021  6:33 PM      Failed - Total Cholesterol in normal range and within 360 days    Cholesterol, Total  Date Value Ref Range Status  01/18/2021 257 (H) 100 - 199 mg/dL Final         Failed - LDL in normal range and within 360 days    LDL Chol Calc (NIH)  Date Value Ref Range Status  01/18/2021 157 (H) 0 - 99 mg/dL Final         Failed - HDL in normal range and within 360 days    HDL  Date Value  Ref Range Status  01/18/2021 31 (L) >39 mg/dL Final         Failed - Triglycerides in normal range and within 360 days    Triglycerides  Date Value Ref Range Status  01/18/2021 365 (H) 0 - 149 mg/dL Final         Passed - Patient is not pregnant      Passed - Valid encounter within last 12 months    Recent Outpatient Visits          5 months ago Radicular leg pain   [place], Megan P, DO   6 months ago Radicular leg pain   [facility] [place], [place], DO   12 months ago Primary hypertension   [place], [place], DO   1 year ago Cough   [facility] [place], Megan P, DO   1 year ago Upper respiratory tract infection, unspecified type   [facility] [place], Barb Merino, DO      Future Appointments            In 4 days Wynetta Emery, Barb Merino, DO [employer], [facility]

## 2021-07-30 ENCOUNTER — Other Ambulatory Visit: Payer: Self-pay | Admitting: Family Medicine

## 2021-07-30 ENCOUNTER — Telehealth (INDEPENDENT_AMBULATORY_CARE_PROVIDER_SITE_OTHER): Payer: Medicaid Other | Admitting: Family Medicine

## 2021-07-30 ENCOUNTER — Encounter: Payer: Self-pay | Admitting: Family Medicine

## 2021-07-30 DIAGNOSIS — M5410 Radiculopathy, site unspecified: Secondary | ICD-10-CM

## 2021-07-30 DIAGNOSIS — I10 Essential (primary) hypertension: Secondary | ICD-10-CM | POA: Diagnosis not present

## 2021-07-30 DIAGNOSIS — R7301 Impaired fasting glucose: Secondary | ICD-10-CM | POA: Diagnosis not present

## 2021-07-30 DIAGNOSIS — E7800 Pure hypercholesterolemia, unspecified: Secondary | ICD-10-CM

## 2021-07-30 DIAGNOSIS — N184 Chronic kidney disease, stage 4 (severe): Secondary | ICD-10-CM | POA: Diagnosis not present

## 2021-07-30 DIAGNOSIS — F410 Panic disorder [episodic paroxysmal anxiety] without agoraphobia: Secondary | ICD-10-CM

## 2021-07-30 NOTE — Assessment & Plan Note (Signed)
Stable. Will continue paxil. Continue to monitor.

## 2021-07-30 NOTE — Assessment & Plan Note (Signed)
No labs done in months. Unable to come in today. Will get her back in ASAP for labs.

## 2021-07-30 NOTE — Progress Notes (Signed)
There were no vitals taken for this visit.   Subjective:    Patient ID: Brittany Jacobson, female    DOB: 03-22-1961, 60 y.o.   MRN: [id number]  HPI: Brittany Jacobson is a 60 y.o. female  Chief Complaint  Patient presents with   Depression   Hyperlipidemia   Hypertension   HYPERTENSION / HYPERLIPIDEMIA Satisfied with current treatment? yes Duration of hypertension: chronic BP monitoring frequency: not checking BP range: unknown BP medication side effects: no Past BP meds: amlodipine,  Duration of hyperlipidemia: chronic Cholesterol medication side effects: no Cholesterol supplements: none Past cholesterol medications: atorvastatin Medication compliance: fair compliance Aspirin: no Recent stressors: yes Recurrent headaches: no Visual changes: no Palpitations: no Dyspnea: no Chest pain: no Lower extremity edema: no Dizzy/lightheaded: no  DEPRESSION Mood status: stable Satisfied with current treatment?: yes Symptom severity: moderate  Duration of current treatment : chronic Side effects: no Medication compliance: excellent compliance Psychotherapy/counseling: no  Previous psychiatric medications: paxil Depressed mood: yes Anxious mood: no Anhedonia: no Significant weight loss or gain: no Insomnia: no  Fatigue: yes Feelings of worthlessness or guilt: no Impaired concentration/indecisiveness: no Suicidal ideations: no Hopelessness: no Crying spells: no Depression screen [facility] 2/9 07/30/2021 03/07/2020 01/05/2020 12/17/2019 10/05/2018  Decreased Interest 0 2 2 0 0  Down, Depressed, Hopeless 0 2 2 0 1  PHQ - 2 Score 0 4 4 0 1  Altered sleeping 0 2 3 1 1   Tired, decreased energy 0 1 3 3 3   Change in appetite 3 1 3 3  0  Feeling bad or failure about yourself  0 1 2 0 1  Trouble concentrating 0 0 2 0 0  Moving slowly or fidgety/restless 0 0 2 0 0  Suicidal thoughts 0 0 2 0 0  PHQ-9 Score 3 9 21 7 6   Difficult doing work/chores Somewhat difficult Very difficult Not  difficult at all Not difficult at all -   Never did any PT. Stats that her gabapentin did not make her feel any better. She has not been taking any has not been doing anything for her leg. She notes that it still hurting. No other concerns or complaints at this time  Relevant past medical, surgical, family and social history reviewed and updated as indicated. Interim medical history since our last visit reviewed. Allergies and medications reviewed and updated.  Review of Systems  Constitutional:  Positive for fatigue. Negative for activity change, appetite change, chills, diaphoresis, fever and unexpected weight change.  Respiratory: Negative.    Cardiovascular: Negative.   Gastrointestinal: Negative.   Musculoskeletal:  Positive for back pain and myalgias. Negative for arthralgias, gait problem, joint swelling, neck pain and neck stiffness.  Neurological:  Positive for weakness and numbness. Negative for dizziness, tremors, seizures, syncope, facial asymmetry, speech difficulty, light-headedness and headaches.   Per HPI unless specifically indicated above     Objective:    There were no vitals taken for this visit.  Wt Readings from Last 3 Encounters:  03/19/21 153 lb (69.4 kg)  02/08/21 156 lb (70.8 kg)  02/01/21 156 lb 6.4 oz (70.9 kg)    Physical Exam Vitals and nursing note reviewed.  Constitutional:      General: She is not in acute distress.    Appearance: Normal appearance. She is not ill-appearing, toxic-appearing or diaphoretic.  HENT:     Head: Normocephalic and atraumatic.     Right Ear: External ear normal.     Left Ear: External ear normal.  Nose: Nose normal.     Mouth/Throat:     Mouth: Mucous membranes are moist.     Pharynx: Oropharynx is clear.  Eyes:     General: No scleral icterus.       Right eye: No discharge.        Left eye: No discharge.     Conjunctiva/sclera: Conjunctivae normal.     Pupils: Pupils are equal, round, and reactive to light.   Pulmonary:     Effort: Pulmonary effort is normal. No respiratory distress.     Comments: Speaking in full sentences Musculoskeletal:        General: Normal range of motion.     Cervical back: Normal range of motion.  Skin:    Coloration: Skin is not jaundiced or pale.     Findings: No bruising, erythema, lesion or rash.  Neurological:     Mental Status: She is alert and oriented to person, place, and time. Mental status is at baseline.  Psychiatric:        Mood and Affect: Mood normal.        Behavior: Behavior normal.        Thought Content: Thought content normal.        Judgment: Judgment normal.    Results for orders placed or performed during the hospital encounter of 04/02/21  NM Myocar Multi W/Spect W/Wall Motion / EF  Result Value Ref Range   Rest HR 71 bpm   Rest BP 153/74 mmHg   Exercise duration (sec) 0 sec   Percent HR 61 %   Exercise duration (min) 0 min   Estimated workload 1.0 METS   Peak HR 99 bpm   Peak BP 171/61 mmHg   MPHR 161 bpm   SSS 8    SRS 7    SDS 7    TID 0.88    LV sys vol 32 mL   LV dias vol 92 46 - 106 mL      Assessment & Plan:   Problem List Items Addressed This Visit       Cardiovascular and Mediastinum   Hypertension    Unable to get a BP today. States that she is feeling well. Will get her in ASAP for BP check and physical.         Endocrine   IFG (impaired fasting glucose)    No A1c in over a year. Will get her in for labs ASAP. She notes that she cannot come in today.        Genitourinary   CKD (chronic kidney disease) stage 4, GFR 15-29 ml/min (HCC)    Following with nephrology. Call with any concerns.        Other   Panic attacks    Stable. Will continue paxil. Continue to monitor.       Hypercholesteremia    No labs done in months. Unable to come in today. Will get her back in ASAP for labs.       Radicular leg pain - Primary    Chronic. Did not do her PT. Did not come back for follow up. No longer  taking her gabapentin. Likely needs MRI. Will get her into ortho for further evaluation. Referral generated today.      Relevant Orders   Ambulatory referral to Orthopedic Surgery     Follow up plan: Return ASAP in person.   This visit was completed via video visit through MyChart due to the restrictions of the COVID-19 pandemic. All  issues as above were discussed and addressed. Physical exam was done as above through visual confirmation on video through MyChart. If it was felt that the patient should be evaluated in the office, they were directed there. The patient verbally consented to this visit. Location of the patient: home Location of the provider: work Those involved with this call:  Provider: Park Liter, DO CMA: Yvonna Alanis, [place] Desk/Registration: Earlene Plater Djimraou  Time spent on call:  25 minutes with patient face to face via video conference. More than 50% of this time was spent in counseling and coordination of care. 40 minutes total spent in review of patient's record and preparation of their chart.

## 2021-07-30 NOTE — Assessment & Plan Note (Signed)
No A1c in over a year. Will get her in for labs ASAP. She notes that she cannot come in today.

## 2021-07-30 NOTE — Telephone Encounter (Signed)
Requested medications are due for refill today Sent to [place] in [place] 4 days ago  Requested medications are on the active medication list all except Klonopin  Last refill sent in to [place] 4 days ago  Last visit 07/30/21  Future visit scheduled no  Notes to clinic please assess.  Requested Prescriptions  Pending Prescriptions Disp Refills   amLODipine (NORVASC) 10 MG tablet 90 tablet 0    Sig: Take 1 tablet (10 mg total) by mouth daily.     Cardiovascular:  Calcium Channel Blockers Passed - 07/30/2021  5:27 PM      Passed - Last BP in normal range    BP Readings from Last 1 Encounters:  03/19/21 130/70          Passed - Valid encounter within last 6 months    Recent Outpatient Visits           Today Radicular leg pain   [place], Megan P, DO   5 months ago Radicular leg pain   [place], [place], DO   6 months ago Radicular leg pain   [place], [place], DO   1 year ago Primary hypertension   [place], [place], DO   1 year ago Cough   [facility] [place], Megan P, DO               ezetimibe (ZETIA) 10 MG tablet 90 tablet 0    Sig: Take 1 tablet (10 mg total) by mouth daily.     Cardiovascular:  Antilipid - Sterol Transport Inhibitors Failed - 07/30/2021  5:27 PM      Failed - Total Cholesterol in normal range and within 360 days    Cholesterol, Total  Date Value Ref Range Status  01/18/2021 257 (H) 100 - 199 mg/dL Final          Failed - LDL in normal range and within 360 days    LDL Chol Calc (NIH)  Date Value Ref Range Status  01/18/2021 157 (H) 0 - 99 mg/dL Final          Failed - HDL in normal range and within 360 days    HDL  Date Value Ref Range Status  01/18/2021 31 (L) >39 mg/dL Final          Failed - Triglycerides in normal range and within 360 days    Triglycerides  Date Value Ref Range Status  01/18/2021 365 (H) 0 - 149 mg/dL  Final          Passed - Valid encounter within last 12 months    Recent Outpatient Visits           Today Radicular leg pain   [place], Megan P, DO   5 months ago Radicular leg pain   [facility] [place], [place], DO   6 months ago Radicular leg pain   [place], [place], DO   1 year ago Primary hypertension   [facility] [place], [place], DO   1 year ago Cough   [employer], Megan P, DO               PARoxetine (PAXIL) 40 MG tablet 90 tablet 0    Sig: TAKE 1 TABLET BY MOUTH ONCE DAILY FOR STRESS.     Psychiatry:  Antidepressants - SSRI Passed - 07/30/2021  5:27 PM  Passed - Valid encounter within last 6 months    Recent Outpatient Visits           Today Radicular leg pain   [place], Megan P, DO   5 months ago Radicular leg pain   [place], [place], DO   6 months ago Radicular leg pain   [place], [place], DO   1 year ago Primary hypertension   [place], [place], DO   1 year ago Cough   [facility] [place], Megan P, DO               atorvastatin (LIPITOR) 80 MG tablet 90 tablet 0    Sig: Take 1 tablet (80 mg total) by mouth daily. TAKE 1 TABLET BY MOUTH ONCE DAILY FOR CHOLESTEROL     Cardiovascular:  Antilipid - Statins Failed - 07/30/2021  5:27 PM      Failed - Total Cholesterol in normal range and within 360 days    Cholesterol, Total  Date Value Ref Range Status  01/18/2021 257 (H) 100 - 199 mg/dL Final          Failed - LDL in normal range and within 360 days    LDL Chol Calc (NIH)  Date Value Ref Range Status  01/18/2021 157 (H) 0 - 99 mg/dL Final          Failed - HDL in normal range and within 360 days    HDL  Date Value Ref Range Status  01/18/2021 31 (L) >39 mg/dL Final          Failed - Triglycerides in normal range and  within 360 days    Triglycerides  Date Value Ref Range Status  01/18/2021 365 (H) 0 - 149 mg/dL Final          Passed - Patient is not pregnant      Passed - Valid encounter within last 12 months    Recent Outpatient Visits           Today Radicular leg pain   [place], Megan P, DO   5 months ago Radicular leg pain   [facility] [place], Megan P, DO   6 months ago Radicular leg pain   [place], [place], DO   1 year ago Primary hypertension   [facility] [place], [place], DO   1 year ago Cough   [facility] [place], Megan P, DO               clonazePAM (KLONOPIN) 0.5 MG tablet 30 tablet     Sig: Take 1 tablet (0.5 mg total) by mouth 2 (two) times daily as needed for anxiety.     Not Delegated - Psychiatry:  Anxiolytics/Hypnotics Failed - 07/30/2021  5:27 PM      Failed - This refill cannot be delegated      Failed - Urine Drug Screen completed in last 360 days      Passed - Valid encounter within last 6 months    Recent Outpatient Visits           Today Radicular leg pain   [place], Megan P, DO   5 months ago Radicular leg pain   [place], Megan P, DO   6 months ago Radicular leg pain   [place], [place], DO   1 year  ago Primary hypertension   [place], [place], DO   1 year ago Cough   [place], [place], DO

## 2021-07-30 NOTE — Telephone Encounter (Signed)
Copied from [place] [id number]. Topic: Quick Communication - Rx Refill/Question >> Jul 30, 2021  4:54 PM Tessa Lerner A wrote: Medication: amLODipine (NORVASC) 10 MG tablet [[id number]]   ezetimibe (ZETIA) 10 MG tablet [[id number]]   PARoxetine (PAXIL) 40 MG tablet [[id number]]   atorvastatin (LIPITOR) 80 MG tablet [[id number]]   azePAM (KLONOPIN) 0.5 MG tablet [[id number]]   Has the patient contacted their pharmacy? Yes.  The medications were originally submitted to an out of state pharmacy because the patient is visiting. The pharmacy out of state doesn't accept their [place] insurance.   (Agent: If no, request that the patient contact the pharmacy for the refill. If patient does not wish to contact the pharmacy document the reason why and proceed with request.) (Agent: If yes, when and what did the pharmacy advise?)  Preferred Pharmacy (with phone number or street name): [facility], [place]  Phone:  [phone] Fax:  [phone]  Has the patient been seen for an appointment in the last year OR does the patient have an upcoming appointment? Yes.    Agent: Please be advised that RX refills may take up to 3 business days. We ask that you follow-up with your pharmacy.

## 2021-07-30 NOTE — Assessment & Plan Note (Signed)
Following with nephrology. Call with any concerns.

## 2021-07-30 NOTE — Assessment & Plan Note (Signed)
Unable to get a BP today. States that she is feeling well. Will get her in ASAP for BP check and physical.

## 2021-07-30 NOTE — Assessment & Plan Note (Signed)
Chronic. Did not do her PT. Did not come back for follow up. No longer taking her gabapentin. Likely needs MRI. Will get her into ortho for further evaluation. Referral generated today.

## 2021-08-01 NOTE — Progress Notes (Signed)
Lmom asking pt to call back to schedule appt within 1-2 months.

## 2021-08-03 NOTE — Telephone Encounter (Signed)
Fyi.

## 2021-08-03 NOTE — Telephone Encounter (Signed)
Left a message for patient to give our office a call back to regarding patient request. Called to let me patient know she can reach out to [facility] in [place] and have them transferred due to [facility] being out of the office. OK to give patient message if she calls back to our office.

## 2021-08-03 NOTE — Telephone Encounter (Signed)
Patient called in to inform dr Wynetta Emery that she need all medication that was sent to the pharmacy in [place] to be sent to the [facility] so she can pick them up per patient she is almost out of some medication. Please advise Ph#  [phone]

## 2021-08-09 ENCOUNTER — Ambulatory Visit: Payer: Self-pay | Admitting: *Deleted

## 2021-08-09 MED ORDER — PAROXETINE HCL 40 MG PO TABS: ORAL_TABLET | ORAL | 0 refills | Status: AC

## 2021-08-09 MED ORDER — EZETIMIBE 10 MG PO TABS: 10.0000 mg | ORAL_TABLET | Freq: Every day | ORAL | 0 refills | Status: AC

## 2021-08-09 MED ORDER — AMLODIPINE BESYLATE 10 MG PO TABS: 10.0000 mg | ORAL_TABLET | Freq: Every day | ORAL | 0 refills | Status: AC

## 2021-08-09 MED ORDER — ATORVASTATIN CALCIUM 80 MG PO TABS: 80.0000 mg | ORAL_TABLET | Freq: Every day | ORAL | 0 refills | Status: AC

## 2021-08-09 NOTE — Telephone Encounter (Signed)
Patient is requesting medication to be sent to local [facility] as she never received, the prescriptions from the [facility] in [place]. Patient is also requesting a prescription for Klonopin but I do not see in patient's chart where it is a current active med. Please advise?

## 2021-08-09 NOTE — Addendum Note (Signed)
Addended by: [name] on: 08/09/2021 04:42 PM   Modules accepted: Orders

## 2021-08-09 NOTE — Telephone Encounter (Signed)
Left a message regarding Dr.Johnson's recommendations. Advised patient to give our office a call back if she has any questions or concerns.

## 2021-08-09 NOTE — Addendum Note (Signed)
Addended by: [name] on: 08/09/2021 12:21 PM   Modules accepted: Orders

## 2021-08-09 NOTE — Telephone Encounter (Signed)
Dr. Wynetta Emery, can Atorvastatin be sent in for the patient? Looks like It was the only one not sent in.

## 2021-08-09 NOTE — Addendum Note (Signed)
Addended by: Irena Reichmann on: 08/09/2021 11:58 AM   Modules accepted: Orders

## 2021-08-09 NOTE — Telephone Encounter (Signed)
Spoke with patient who is asking for all of her prescriptions to be transferred to the new pharmacy, [facility] on [address]. Earlier today several medications were sent in including Zetia, Amlodipine and Paxil. Hydroxyzine and Diona Fanti has already been transferred to the new pharmacy. Atorvastatin is the only medication prescribed by Dr. Wynetta Emery that has not been transferred.  It was after the call that I saw she needs lab work-lipids ordered in Epic already. Called the patient left VM to call and schedule a lab visit for lipids before we can refill and transfer Atorvastatin. Will try the patient again today.  No triage completed. Patient only requesting prescription transfers to new pharmacy.

## 2021-08-09 NOTE — Progress Notes (Signed)
LMOM for pt to call office to schedule apt for 1-2 months per Dr. Wynetta Emery

## 2021-08-09 NOTE — Telephone Encounter (Signed)
Not on klonopin. Will not send in refill for that. She told us in September that she needed the medicine sent to [place], and when I spoke to her on 10/24 she was in [place] and did not know when she was going to be coming back to [place]. I am happy to send them to [place]. As discussed at her appointment on 10/24, she needs to be seen ASAP in person

## 2021-08-09 NOTE — Telephone Encounter (Signed)
Patient would like all medications sent on 07/26/2021 sent to   [facility] [address], [place], [place] [address]  [phone]  As per [facility] on [address] they can not pull medication from the [facility] in [place] and is requesting PCP to send in new scripts.   Patient is very upset and would like this request expedited with a follow up call when completed.

## 2021-08-09 NOTE — Addendum Note (Signed)
Addended by: Georgina Peer on: 08/09/2021 04:41 PM   Modules accepted: Orders

## 2022-02-04 IMAGING — DX DG LUMBAR SPINE COMPLETE 4+V
5 series · 5 of 5 positions shown · non-contrast
Comparison: CT scan from 7852

CLINICAL DATA: Back and right leg pain.

EXAM:
LUMBAR SPINE - COMPLETE 4+ VIEW

[l-spine ap]
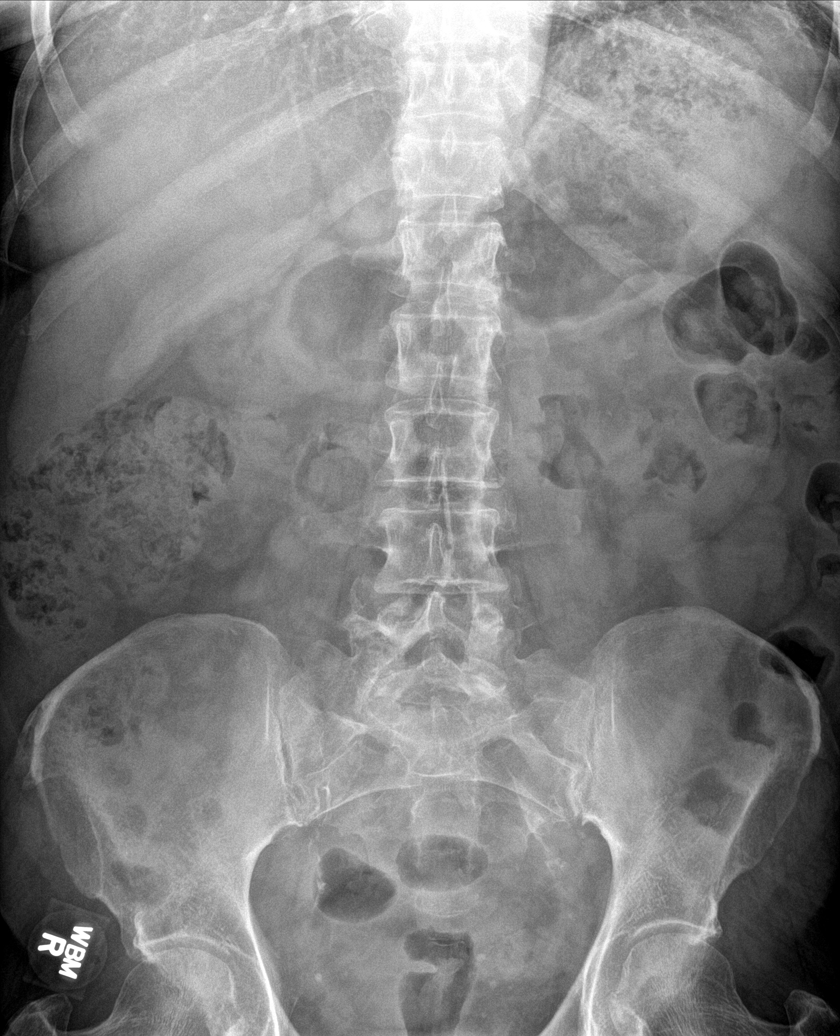

[l-spine obl (1 of 2)]
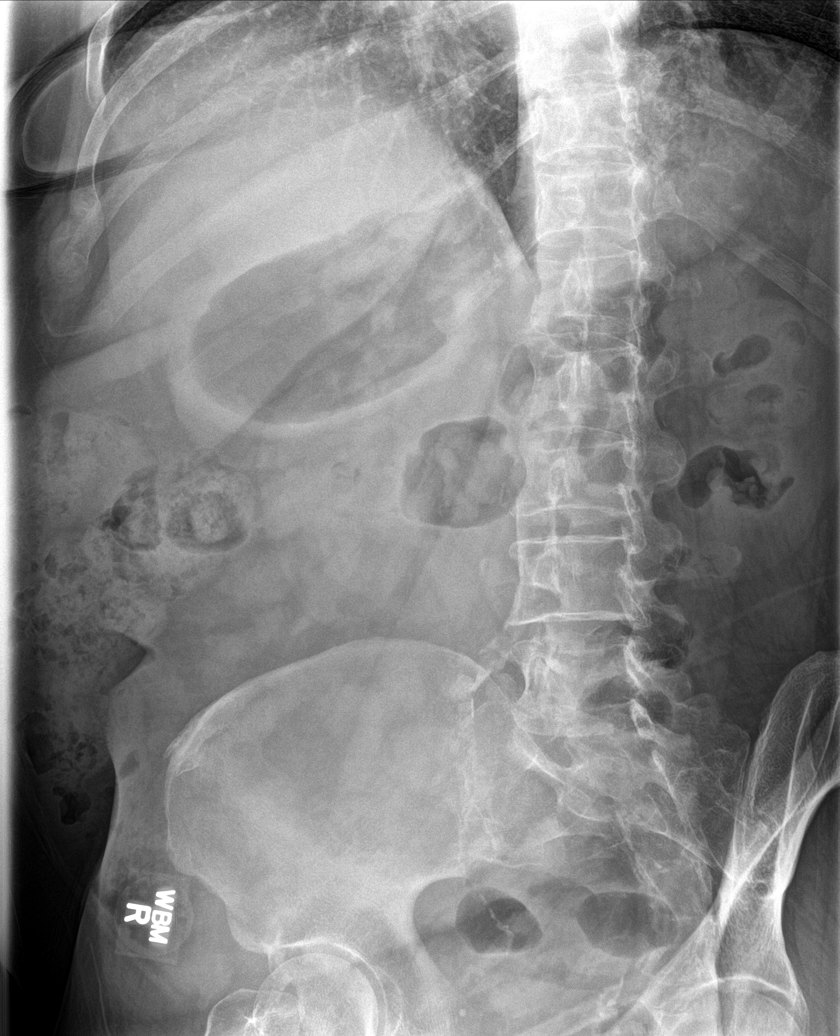

[l-spine obl (2 of 2)]
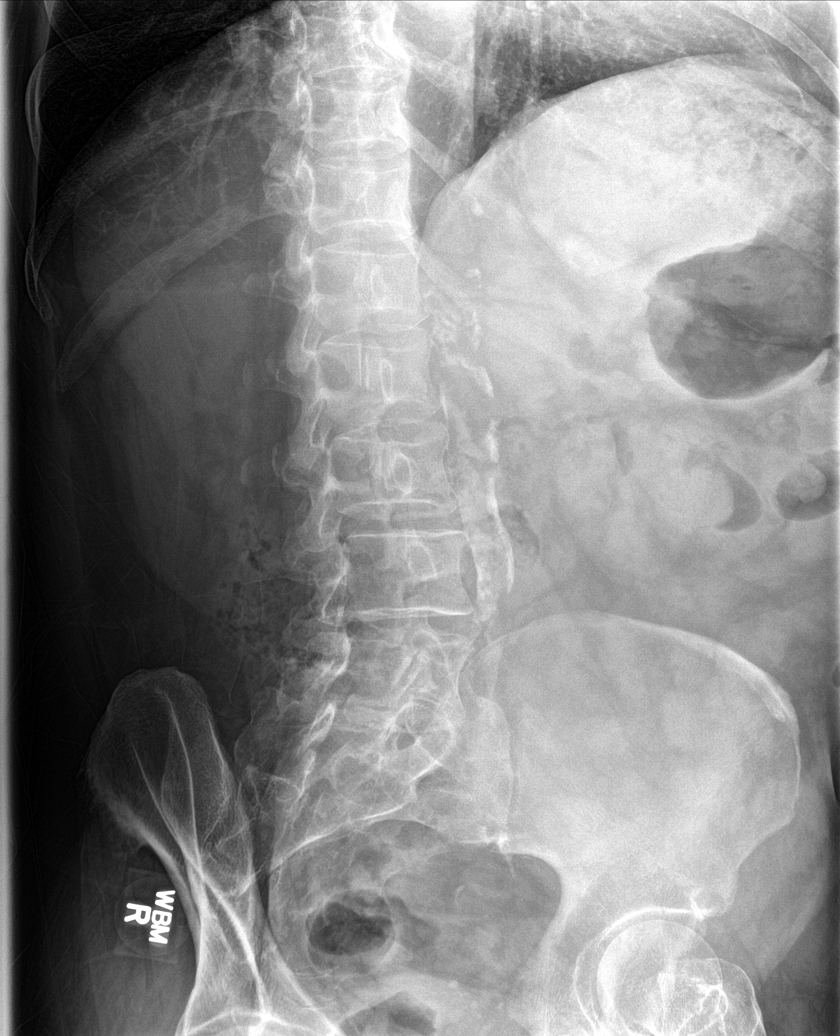

[l-spine lat]
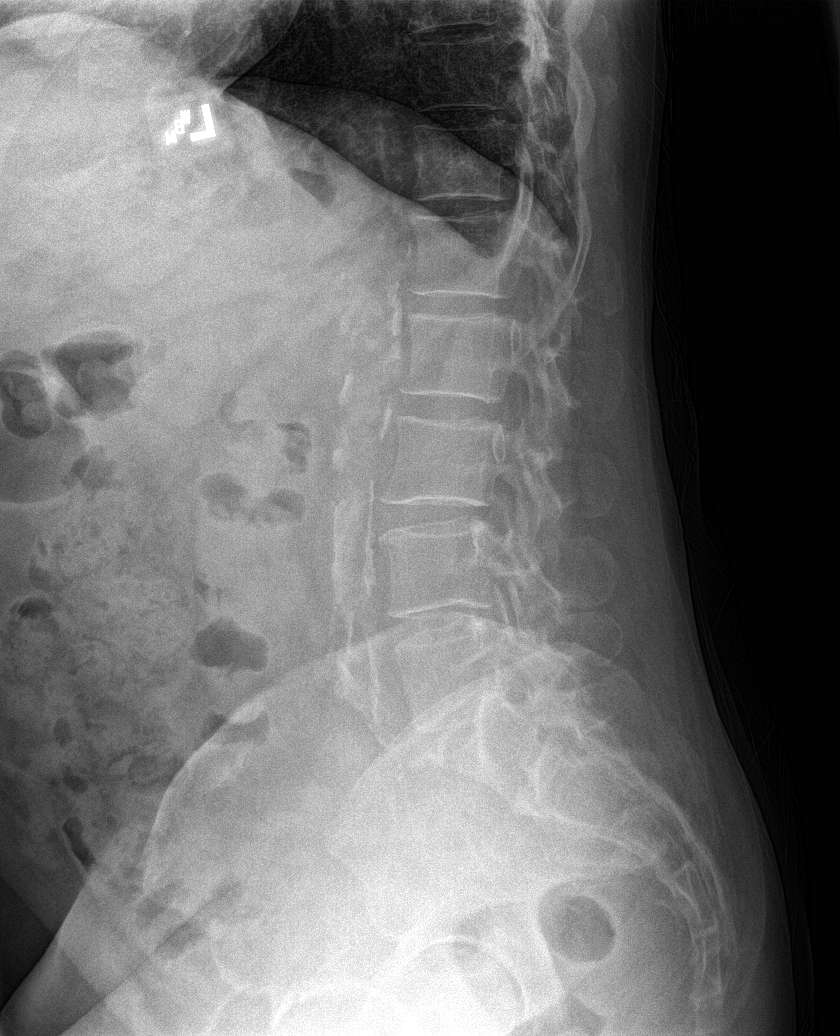

[l-spine spot]
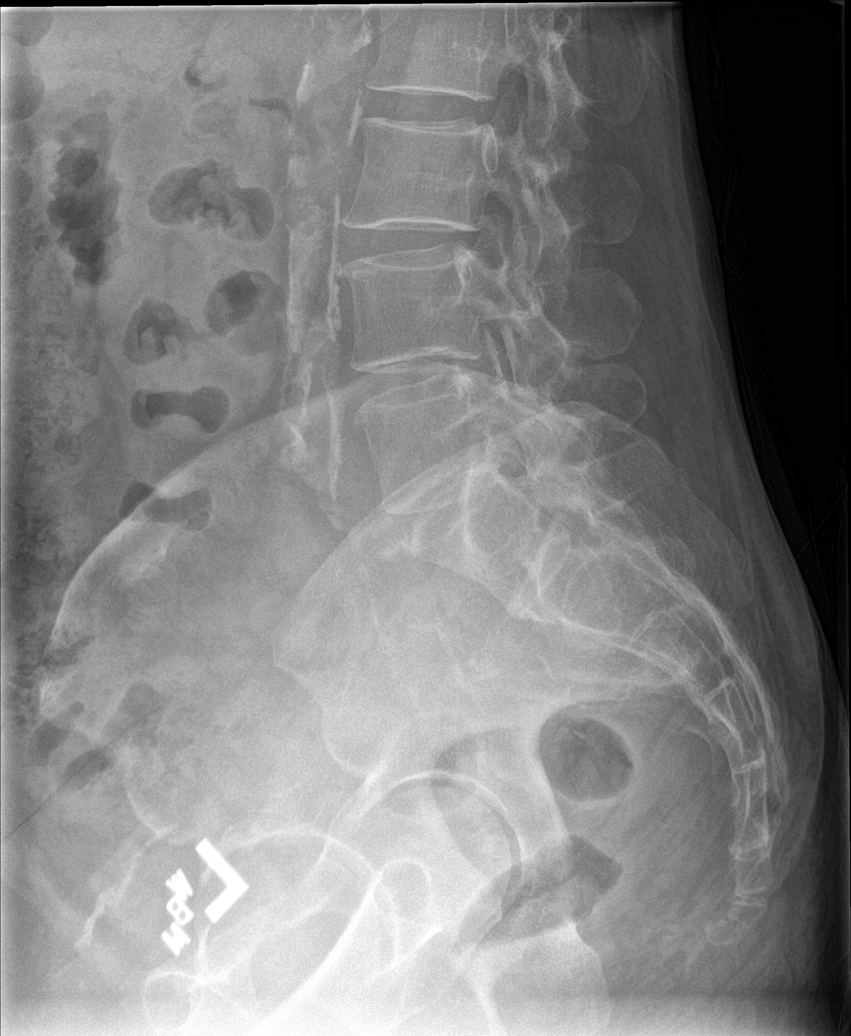

[5 of 5 positions shown; findings below may reference images not displayed]

FINDINGS: Normal alignment of the lumbar vertebral bodies. Disc spaces and
vertebral bodies are maintained. The facets are normally aligned. No
pars defects. No acute bony findings or destructive bony changes.
The visualized bony pelvis is intact and the SI joints appear
normal.

Significant age advanced vascular calcifications.
IMPRESSION: 1. Normal alignment and no acute bony findings or significant
degenerative changes.
2. Significant age advanced vascular calcifications.

## 2022-09-20 ENCOUNTER — Other Ambulatory Visit: Payer: Self-pay | Admitting: Family Medicine

## 2022-09-20 DIAGNOSIS — F410 Panic disorder [episodic paroxysmal anxiety] without agoraphobia: Secondary | ICD-10-CM

## 2022-09-20 NOTE — Telephone Encounter (Signed)
Requested medications are due for refill today.  unsure  Requested medications are on the active medications list.  yes  Last refill. Both refilled 08/09/2021 #90 0 rf  Future visit scheduled.   no  Notes to clinic.  Pt last seen 01/12/2021. Pt is more than 3 months over due for OV.    Requested Prescriptions  Pending Prescriptions Disp Refills   PARoxetine (PAXIL) 40 MG tablet [Pharmacy Med Name: PARoxetine HCl 40 MG Oral Tablet] 90 tablet 0    Sig: TAKE 1 TABLET BY MOUTH ONCE DAILY FOR STRESS.     Psychiatry:  Antidepressants - SSRI Failed - 09/20/2022 10:49 AM      Failed - Valid encounter within last 6 months    Recent Outpatient Visits           1 year ago Radicular leg pain   [place], Megan P, DO   1 year ago Radicular leg pain   [place], [place], DO   1 year ago Radicular leg pain   [place], [place], DO   2 years ago Primary hypertension   [place], [place], DO   2 years ago Cough   [place], Megan P, DO               ezetimibe (ZETIA) 10 MG tablet [Pharmacy Med Name: Ezetimibe 10 MG Oral Tablet] 90 tablet 0    Sig: Take 1 tablet by mouth once daily     Cardiovascular:  Antilipid - Sterol Transport Inhibitors Failed - 09/20/2022 10:49 AM      Failed - AST in normal range and within 360 days    AST  Date Value Ref Range Status  01/18/2021 14 0 - 40 IU/L Final         Failed - ALT in normal range and within 360 days    ALT  Date Value Ref Range Status  01/18/2021 10 0 - 32 IU/L Final         Failed - Valid encounter within last 12 months    Recent Outpatient Visits           1 year ago Radicular leg pain   [place], Megan P, DO   1 year ago Radicular leg pain   [facility] [place], [place], DO   1 year ago Radicular leg pain   [place], [place], DO   2 years ago  Primary hypertension   [facility] [place], [place], DO   2 years ago Cough   [facility] [place], Megan P, DO              Failed - Lipid Panel in normal range within the last 12 months    Cholesterol, Total  Date Value Ref Range Status  01/18/2021 257 (H) 100 - 199 mg/dL Final   LDL Chol Calc (NIH)  Date Value Ref Range Status  01/18/2021 157 (H) 0 - 99 mg/dL Final   HDL  Date Value Ref Range Status  01/18/2021 31 (L) >39 mg/dL Final   Triglycerides  Date Value Ref Range Status  01/18/2021 365 (H) 0 - 149 mg/dL Final         Passed - Patient is not pregnant
